# Patient Record
Sex: Female | Born: 1970
Health system: Southern US, Community
[De-identification: ages and names within clinical notes are randomized; demographics above are authoritative.]

## PROBLEM LIST (undated history)

## (undated) DIAGNOSIS — R102 Pelvic and perineal pain: Secondary | ICD-10-CM

## (undated) DIAGNOSIS — I10 Essential (primary) hypertension: Secondary | ICD-10-CM

## (undated) DIAGNOSIS — G43909 Migraine, unspecified, not intractable, without status migrainosus: Secondary | ICD-10-CM

## (undated) DIAGNOSIS — M797 Fibromyalgia: Secondary | ICD-10-CM

## (undated) DIAGNOSIS — N809 Endometriosis, unspecified: Secondary | ICD-10-CM

## (undated) DIAGNOSIS — I8290 Acute embolism and thrombosis of unspecified vein: Secondary | ICD-10-CM

## (undated) DIAGNOSIS — I639 Cerebral infarction, unspecified: Secondary | ICD-10-CM

## (undated) HISTORY — DX: Migraine, unspecified, not intractable, without status migrainosus: G43.909

## (undated) HISTORY — PX: LAPAROSCOPY: SHX197

## (undated) HISTORY — PX: ABDOMINAL HYSTERECTOMY: SHX81

## (undated) HISTORY — DX: Fibromyalgia: M79.7

## (undated) HISTORY — DX: Pelvic and perineal pain: R10.2

## (undated) HISTORY — DX: Endometriosis, unspecified: N80.9

## (undated) HISTORY — DX: Cerebral infarction, unspecified: I63.9

---

## 2002-05-18 ENCOUNTER — Emergency Department (HOSPITAL_COMMUNITY): Admission: EM | Admit: 2002-05-18 | Discharge: 2002-05-18 | Payer: Self-pay | Admitting: *Deleted

## 2002-05-18 ENCOUNTER — Encounter: Payer: Self-pay | Admitting: Emergency Medicine

## 2004-12-15 ENCOUNTER — Other Ambulatory Visit: Payer: Self-pay

## 2004-12-15 ENCOUNTER — Emergency Department: Payer: Self-pay | Admitting: Emergency Medicine

## 2005-09-05 ENCOUNTER — Observation Stay: Payer: Self-pay | Admitting: Obstetrics and Gynecology

## 2005-11-24 ENCOUNTER — Observation Stay: Payer: Self-pay | Admitting: Obstetrics and Gynecology

## 2005-12-11 ENCOUNTER — Observation Stay: Payer: Self-pay

## 2005-12-14 ENCOUNTER — Inpatient Hospital Stay: Payer: Self-pay | Admitting: Obstetrics and Gynecology

## 2006-12-08 ENCOUNTER — Ambulatory Visit: Payer: Self-pay | Admitting: General Practice

## 2007-01-21 ENCOUNTER — Ambulatory Visit: Payer: Self-pay | Admitting: Obstetrics and Gynecology

## 2007-09-07 ENCOUNTER — Ambulatory Visit: Payer: Self-pay | Admitting: General Practice

## 2008-11-29 ENCOUNTER — Emergency Department: Payer: Self-pay

## 2009-03-19 ENCOUNTER — Ambulatory Visit: Payer: Self-pay

## 2009-04-09 ENCOUNTER — Inpatient Hospital Stay: Payer: Self-pay

## 2010-01-01 ENCOUNTER — Ambulatory Visit: Payer: Self-pay | Admitting: General Practice

## 2010-12-15 ENCOUNTER — Inpatient Hospital Stay: Payer: Self-pay | Admitting: Internal Medicine

## 2010-12-22 ENCOUNTER — Ambulatory Visit: Payer: Self-pay | Admitting: General Practice

## 2011-01-01 ENCOUNTER — Ambulatory Visit: Payer: Self-pay | Admitting: General Practice

## 2011-01-30 ENCOUNTER — Ambulatory Visit: Payer: Self-pay | Admitting: Unknown Physician Specialty

## 2011-02-03 LAB — PATHOLOGY REPORT

## 2012-05-15 ENCOUNTER — Emergency Department: Payer: Self-pay | Admitting: Emergency Medicine

## 2012-06-06 ENCOUNTER — Ambulatory Visit: Payer: Self-pay | Admitting: General Practice

## 2012-12-11 ENCOUNTER — Emergency Department: Payer: Self-pay | Admitting: Emergency Medicine

## 2012-12-11 LAB — URINALYSIS, COMPLETE
Bacteria: NONE SEEN
Bilirubin,UR: NEGATIVE
Blood: NEGATIVE
Ketone: NEGATIVE
Leukocyte Esterase: NEGATIVE
Nitrite: NEGATIVE
Ph: 6 (ref 4.5–8.0)
RBC,UR: <1 /HPF (ref 0–5)
Specific Gravity: 1.014 (ref 1.003–1.030)
Squamous Epithelial: 1
WBC UR: <1 /HPF (ref 0–5)

## 2012-12-11 LAB — COMPREHENSIVE METABOLIC PANEL
Albumin: 3.8 g/dL (ref 3.4–5.0)
Alkaline Phosphatase: 57 U/L (ref 50–136)
Anion Gap: 5 — ABNORMAL LOW (ref 7–16)
BUN: 10 mg/dL (ref 7–18)
Bilirubin,Total: 0.6 mg/dL (ref 0.2–1.0)
Calcium, Total: 8.7 mg/dL (ref 8.5–10.1)
Chloride: 109 mmol/L — ABNORMAL HIGH (ref 98–107)
Co2: 26 mmol/L (ref 21–32)
Creatinine: 0.85 mg/dL (ref 0.60–1.30)
EGFR (Non-African Amer.): >60
Glucose: 100 mg/dL — ABNORMAL HIGH (ref 65–99)
Potassium: 3.7 mmol/L (ref 3.5–5.1)
SGOT(AST): 22 U/L (ref 15–37)
SGPT (ALT): 30 U/L (ref 12–78)
Sodium: 140 mmol/L (ref 136–145)
Total Protein: 7.4 g/dL (ref 6.4–8.2)

## 2012-12-11 LAB — CBC
HCT: 41.1 % (ref 35.0–47.0)
HGB: 13.8 g/dL (ref 12.0–16.0)
MCH: 31.4 pg (ref 26.0–34.0)
MCV: 94 fL (ref 80–100)
Platelet: 206 10*3/uL (ref 150–440)
RDW: 13.6 % (ref 11.5–14.5)
WBC: 7.5 10*3/uL (ref 3.6–11.0)

## 2012-12-11 LAB — LIPASE, BLOOD: Lipase: 97 U/L (ref 73–393)

## 2014-12-27 ENCOUNTER — Ambulatory Visit
Admit: 2014-12-27 | Disposition: A | Discharge status: Home / Self Care | Payer: Self-pay | Attending: Internal Medicine | Admitting: Internal Medicine

## 2015-01-18 ENCOUNTER — Ambulatory Visit
Admit: 2015-01-18 | Disposition: A | Discharge status: Home / Self Care | Payer: Self-pay | Attending: Internal Medicine | Admitting: Internal Medicine

## 2015-05-24 ENCOUNTER — Inpatient Hospital Stay: Payer: Self-pay | Admitting: Family Medicine

## 2015-12-28 ENCOUNTER — Emergency Department: Payer: BLUE CROSS/BLUE SHIELD

## 2015-12-28 ENCOUNTER — Emergency Department
Admission: EM | Admit: 2015-12-28 | Discharge: 2015-12-28 | Disposition: A | Discharge status: Home / Self Care | Payer: BLUE CROSS/BLUE SHIELD | Attending: Emergency Medicine | Admitting: Emergency Medicine

## 2015-12-28 ENCOUNTER — Encounter: Payer: Self-pay | Admitting: Emergency Medicine

## 2015-12-28 DIAGNOSIS — Y9289 Other specified places as the place of occurrence of the external cause: Secondary | ICD-10-CM | POA: Insufficient documentation

## 2015-12-28 DIAGNOSIS — W01198A Fall on same level from slipping, tripping and stumbling with subsequent striking against other object, initial encounter: Secondary | ICD-10-CM | POA: Insufficient documentation

## 2015-12-28 DIAGNOSIS — S8391XA Sprain of unspecified site of right knee, initial encounter: Secondary | ICD-10-CM | POA: Diagnosis not present

## 2015-12-28 DIAGNOSIS — Y998 Other external cause status: Secondary | ICD-10-CM | POA: Diagnosis not present

## 2015-12-28 DIAGNOSIS — S80211S Abrasion, right knee, sequela: Secondary | ICD-10-CM

## 2015-12-28 DIAGNOSIS — S8991XA Unspecified injury of right lower leg, initial encounter: Secondary | ICD-10-CM | POA: Diagnosis present

## 2015-12-28 DIAGNOSIS — Y9389 Activity, other specified: Secondary | ICD-10-CM | POA: Insufficient documentation

## 2015-12-28 DIAGNOSIS — S8001XA Contusion of right knee, initial encounter: Secondary | ICD-10-CM | POA: Diagnosis not present

## 2015-12-28 DIAGNOSIS — S80211A Abrasion, right knee, initial encounter: Secondary | ICD-10-CM | POA: Diagnosis not present

## 2015-12-28 MED ORDER — BACITRACIN ZINC 500 UNIT/GM EX OINT
TOPICAL_OINTMENT | Freq: Two times a day (BID) | CUTANEOUS | Status: DC
  Administered 2015-12-28: 1 via TOPICAL

## 2015-12-28 MED ORDER — NAPROXEN 500 MG PO TABS: 500.0000 mg | ORAL_TABLET | Freq: Two times a day (BID) | ORAL | Status: DC

## 2015-12-28 MED ORDER — BACITRACIN ZINC 500 UNIT/GM EX OINT
TOPICAL_OINTMENT | CUTANEOUS | Status: AC
  Administered 2015-12-28: 1 via TOPICAL
  Filled 2015-12-28: qty 0.9

## 2015-12-28 NOTE — Discharge Instructions (Signed)
Wear knee immobilizer as needed.

## 2015-12-28 NOTE — ED Provider Notes (Signed)
[facility] Emergency Department Provider Note  ____________________________________________  Time seen: Approximately 2:56 PM  I have reviewed the triage vital signs and the nursing notes   HISTORY  Chief Complaint Fall    HPI Sara Fitzpatrick is a 45 y.o. female patient complaining of right knee pain secondary to a fall this morning. Patient she fell landing on her left knee striking concrete. Patient states since the incident she's has increased pain and decreased flexion of the knee. Patient state pain with ambulation.Patient stated no relief of OTC NSAIDs. Patient rates the pain 6/10. Patient rates the pain as sharp. No other palliative measures for this complaint.   History reviewed. No pertinent past medical history.  There are no active problems to display for this patient.   History reviewed. No pertinent past surgical history.  No current outpatient prescriptions on file.  Allergies Morphine and related  History reviewed. No pertinent family history.  Social History Social History  Substance Use Topics  . Smoking status: Never Smoker   . Smokeless tobacco: None  . Alcohol Use: No    Review of Systems Constitutional: No fever/chills Eyes: No visual changes. ENT: No sore throat. Cardiovascular: Denies chest pain. Respiratory: Denies shortness of breath. Gastrointestinal: No abdominal pain.  No nausea, no vomiting.  No diarrhea.  No constipation. Genitourinary: Negative for dysuria. Musculoskeletal: Right knee pain Skin: Negative for rash. Neurological: Negative for headaches, focal weakness or numbness. Allergic/Immunilogical: Morphine  10-point ROS otherwise negative.  ____________________________________________   PHYSICAL EXAM:  VITAL SIGNS: ED Triage Vitals  Enc Vitals Group     BP 12/28/15 1318 142/78 mmHg     Pulse Rate 12/28/15 1318 101     Resp 12/28/15 1318 18     Temp 12/28/15 1318 97.7 F (36.5 C)   Temp Source 12/28/15 1318 Oral     SpO2 12/28/15 1318 100 %     Weight 12/28/15 1318 161 lb (73.029 kg)     Height 12/28/15 1318  (1.575 m)     Head Cir --      Peak Flow --      Pain Score 12/28/15 1318 6     Pain Loc --      Pain Edu? --      Excl. in GC? --     Constitutional: Alert and oriented. Well appearing and in no acute distress. Eyes: Conjunctivae are normal. PERRL. EOMI. Head: Atraumatic. Nose: No congestion/rhinnorhea. Mouth/Throat: Mucous membranes are moist.  Oropharynx non-erythematous. Neck: No stridor.  No cervical spine tenderness to palpation. Cardiovascular: Normal rate, regular rhythm. Grossly normal heart sounds.  Good peripheral circulation. Respiratory: Normal respiratory effort.  No retractions. Lungs CTAB. Gastrointestinal: Soft and nontender. No distention. No abdominal bruits. No CVA tenderness. Musculoskeletal: No deformity to the right knee. No loss edema. Patient has mild guarding palpation anterior patella and insertion point of the medial collateral ligament. Patient decreased range of motion with flexion secondary to complain of pain. No laxity with valgus and varus stress testing. Neurologic:  Normal speech and language. No gross focal neurologic deficits are appreciated. No gait instability. Skin:  Skin is warm, dry and intact. Abrasion anterior knee  Psychiatric: Mood and affect are normal. Speech and behavior are normal.  ____________________________________________   LABS (all labs ordered are listed, but only abnormal results are displayed)  Labs Reviewed - No data to display ____________________________________________  EKG   ____________________________________________  RADIOLOGY  No acute findings on x-ray. ____________________________________________   PROCEDURES  Procedure(s) performed: None  Critical Care performed: No  ____________________________________________   INITIAL IMPRESSION / ASSESSMENT AND PLAN / ED  COURSE  Pertinent labs & imaging results that were available during my care of the patient were reviewed by me and considered in my medical decision making (see chart for details).  Right knee contusion, right knee abrasion, and MCL strain of the right knee. Discussed x-ray findings with patient. She given discharge instructions. Patient advised to [facility] condition [facility]. ____________________________________________   FINAL CLINICAL IMPRESSION(S) / ED DIAGNOSES  Final diagnoses:  Contusion of right knee, initial encounter  Abrasion, right knee, sequela  Knee sprain, right, initial encounter      Joni ReiningRonald K Smith, PA-C 12/28/15 1608  Emily FilbertJonathan E Williams, MD 12/28/15 1725

## 2015-12-28 NOTE — ED Notes (Signed)
Pt to ed from home with c/o left knee pain and left ankle pain after falling.  Pt states she landed on left knee on cement.

## 2016-04-08 ENCOUNTER — Other Ambulatory Visit: Payer: Self-pay | Admitting: Family Medicine

## 2016-04-08 DIAGNOSIS — G43909 Migraine, unspecified, not intractable, without status migrainosus: Secondary | ICD-10-CM

## 2016-04-08 DIAGNOSIS — R531 Weakness: Secondary | ICD-10-CM

## 2016-04-08 DIAGNOSIS — H9311 Tinnitus, right ear: Secondary | ICD-10-CM

## 2016-04-09 ENCOUNTER — Other Ambulatory Visit: Payer: Self-pay | Admitting: Family Medicine

## 2016-04-09 DIAGNOSIS — Z1231 Encounter for screening mammogram for malignant neoplasm of breast: Secondary | ICD-10-CM

## 2016-04-28 ENCOUNTER — Other Ambulatory Visit: Payer: Self-pay | Admitting: Family Medicine

## 2016-04-28 ENCOUNTER — Ambulatory Visit
Admission: RE | Admit: 2016-04-28 | Discharge: 2016-04-28 | Disposition: A | Discharge status: Home / Self Care | Payer: BLUE CROSS/BLUE SHIELD | Source: Ambulatory Visit | Attending: Family Medicine | Admitting: Family Medicine

## 2016-04-28 DIAGNOSIS — I639 Cerebral infarction, unspecified: Secondary | ICD-10-CM | POA: Insufficient documentation

## 2016-04-28 DIAGNOSIS — R9082 White matter disease, unspecified: Secondary | ICD-10-CM | POA: Insufficient documentation

## 2016-04-28 DIAGNOSIS — Z1231 Encounter for screening mammogram for malignant neoplasm of breast: Secondary | ICD-10-CM

## 2016-04-28 DIAGNOSIS — R531 Weakness: Secondary | ICD-10-CM | POA: Diagnosis present

## 2016-04-28 DIAGNOSIS — G43909 Migraine, unspecified, not intractable, without status migrainosus: Secondary | ICD-10-CM | POA: Insufficient documentation

## 2016-04-29 ENCOUNTER — Ambulatory Visit
Admission: RE | Admit: 2016-04-29 | Discharge: 2016-04-29 | Disposition: A | Discharge status: Home / Self Care | Payer: BLUE CROSS/BLUE SHIELD | Source: Ambulatory Visit | Attending: Family Medicine | Admitting: Family Medicine

## 2016-04-29 DIAGNOSIS — H9311 Tinnitus, right ear: Secondary | ICD-10-CM

## 2016-04-29 DIAGNOSIS — Z1231 Encounter for screening mammogram for malignant neoplasm of breast: Secondary | ICD-10-CM | POA: Diagnosis not present

## 2016-04-29 DIAGNOSIS — G43909 Migraine, unspecified, not intractable, without status migrainosus: Secondary | ICD-10-CM

## 2016-04-29 DIAGNOSIS — R531 Weakness: Secondary | ICD-10-CM

## 2016-04-29 MED ORDER — GADOBENATE DIMEGLUMINE 529 MG/ML IV SOLN
15.0000 mL | Freq: Once | INTRAVENOUS | Status: AC | PRN
  Administered 2016-04-29: 15 mL via INTRAVENOUS

## 2016-09-01 ENCOUNTER — Encounter: Payer: Self-pay | Admitting: Obstetrics and Gynecology

## 2016-09-01 ENCOUNTER — Ambulatory Visit (INDEPENDENT_AMBULATORY_CARE_PROVIDER_SITE_OTHER): Payer: BLUE CROSS/BLUE SHIELD | Admitting: Obstetrics and Gynecology

## 2016-09-01 VITALS — BP 124/75 | HR 100 | Ht 62.0 in | Wt 181.8 lb

## 2016-09-01 DIAGNOSIS — R102 Pelvic and perineal pain: Secondary | ICD-10-CM

## 2016-09-01 DIAGNOSIS — N809 Endometriosis, unspecified: Secondary | ICD-10-CM | POA: Diagnosis not present

## 2016-09-01 DIAGNOSIS — Z98891 History of uterine scar from previous surgery: Secondary | ICD-10-CM | POA: Diagnosis not present

## 2016-09-01 DIAGNOSIS — Z90711 Acquired absence of uterus with remaining cervical stump: Secondary | ICD-10-CM

## 2016-09-01 NOTE — Progress Notes (Signed)
GYN ENCOUNTER NOTE  Subjective:       Sara Fitzpatrick is a 45 y.o. [id number] female is here for gynecologic evaluation of the following issues:  1. Left-sided abdominal pain and mass  Patient presents for evaluation of left-sided abdominal pain and palpable mass that she noted while leaning over the sink doing dishes. She felt pressure type discomfort and fullness. Bowel function is normal. Bladder function is normal. Patient does have chronic pelvic pain with dyspareunia due to endometriosis. These symptoms have not been exacerbated recently.  Summary of complex history:  History of cesarean section and BTL  History of laparoscopy 3  History of abdominal supracervical hysterectomy with evidence of uterus plastered to the anterior abdominal wall  Pathology from hysterectomy demonstrated adenomyosis in a 50 g specimen  [facility] were notable for identification of uterus on 2 ultrasounds and CT scan in 2015. Marland Kitchen.     Gynecologic History No LMP recorded. Patient has had a hysterectomy.Abdominal supracervical hysterectomy  Contraception: status post hysterectomy Last GNF:AOZHYQMPap:Unknown  Last mammogram: BI-RADS 1 04/28/2016   Obstetric History OB History  Gravida Para Term Preterm AB Living  2 1 1   1 1   SAB TAB Ectopic Multiple Live Births    1     1    # Outcome Date GA Lbr Len/2nd Weight Sex Delivery Anes PTL Lv  2 Term 2007   7 lb 1.8 oz (3.225 kg) F CS-LTranv   LIV  1 TAB 2000              Past Medical History:  Diagnosis Date  . Endometriosis   . Fibromyalgia   . Migraine   . Pelvic pain   . Stroke (cerebrum) [facility](HCC)     Past Surgical History:  Procedure Laterality Date  . ABDOMINAL HYSTERECTOMY     lsh  . CESAREAN SECTION    . LAPAROSCOPY     x 3    Current Outpatient Prescriptions on File Prior to Visit  Medication Sig Dispense Refill  . naproxen (NAPROSYN) 500 MG tablet Take 1 tablet (500 mg total) by mouth  2 (two) times daily with a meal. 20 tablet 00   No current facility-administered medications on file prior to visit.     Allergies  Allergen Reactions  . Other Anaphylaxis    paprika  . Hydrocodone-Acetaminophen Itching  . Morphine And Related Other (See Comments)    hypotensive    Social History   Social History  . Marital status: Married    Spouse name: N/A  . Number of children: N/A  . Years of education: N/A   Occupational History  . Not on file.   Social History Main Topics  . Smoking status: Never Smoker  . Smokeless tobacco: Current User  . Alcohol use No  . Drug use: No  . Sexual activity: Yes    Birth control/ protection: Surgical   Other Topics Concern  . Not on file   Social History Narrative  . No narrative on file    Family History  Problem Relation Age of Onset  . Diabetes Mother   . Heart disease Mother   . Heart disease Father   . Leukemia Maternal Grandmother   . Breast cancer Maternal Grandmother     60-70  . Heart disease Maternal Grandmother   . Colon cancer Maternal Grandfather   . Heart failure Maternal Grandfather   . Heart disease Maternal Grandfather   .  Heart failure Paternal Grandmother   . Heart disease Paternal Grandmother   . Heart disease Paternal Grandfather   . Ovarian cancer Neg Hx     The following portions of the patient's history were reviewed and updated as appropriate: allergies, current medications, past family history, past medical history, past social history, past surgical history and problem list.  Review of Systems Review of Systems -Per history of present illness  Objective:   BP 124/75   Pulse 100   Ht 5\' 2"  (1.575 m)   Wt 181 lb 12.8 oz (82.5 kg)   BMI 33.25 kg/m  CONSTITUTIONAL: Well-developed, well-nourished female in no acute distress.  HENT:  Normocephalic, atraumatic.  NECK: Normal range of motion, supple, no masses.  Normal thyroid.  SKIN: Skin is warm and dry. No rash noted. Not  diaphoretic. No erythema. No pallor. NEUROLGIC: Alert and oriented to person, place, and time. PSYCHIATRIC: Normal mood and affect. Normal behavior. Normal judgment and thought content. CARDIOVASCULAR:Not Examined RESPIRATORY: Not Examined BREASTS: Not Examined ABDOMEN: Soft, non distended; Non tender.  No Organomegaly. PELVIC:  External Genitalia: Normal  BUS: Normal  Vagina: Normal  Cervix:Scarred; situated anteriorly in the vagina, mobile, nontender  Uterus:Small, mobile, minimally tender 1/4, broad-based  Adnexa: Normal; Nonpalpable; left lower quadrant tenderness 2/4  RV: Normal   Bladder: Nontender MUSCULOSKELETAL: Normal range of motion. No tenderness.  No cyanosis, clubbing, or edema.     Assessment:   1. Endometriosis - US Pelvis Complete; Future - US Transvaginal Non-OB; Future  2. Pelvic pain - US Pelvis Complete; Future - US Transvaginal Non-OB; Future  3. Status abdominal supracervical hysterectomy   4. History of cesarean section     Plan:   1. Pelvic ultrasound 2. Return in 2 weeks for follow-up  A total of 15 minutes were spent face-to-face with the patient during this encounter and over half of that time dealt with counseling and coordination of care.  Herold HarmsMartin A Austin Pongratz, MD  Note: This dictation was prepared with Dragon dictation along with smaller phrase technology. Any transcriptional errors that result from this process are unintentional.

## 2016-09-01 NOTE — Patient Instructions (Signed)
1. Ultrasound is scheduled to assess ovaries 2. Return in 2 weeks (after ultrasound for further management planning)

## 2016-09-08 ENCOUNTER — Ambulatory Visit (INDEPENDENT_AMBULATORY_CARE_PROVIDER_SITE_OTHER): Payer: BLUE CROSS/BLUE SHIELD

## 2016-09-08 DIAGNOSIS — N809 Endometriosis, unspecified: Secondary | ICD-10-CM | POA: Diagnosis not present

## 2016-09-08 DIAGNOSIS — R102 Pelvic and perineal pain: Secondary | ICD-10-CM | POA: Diagnosis not present

## 2016-09-15 ENCOUNTER — Encounter: Payer: BLUE CROSS/BLUE SHIELD | Admitting: Obstetrics and Gynecology

## 2017-06-02 ENCOUNTER — Other Ambulatory Visit: Payer: Self-pay | Admitting: Family Medicine

## 2017-06-02 ENCOUNTER — Ambulatory Visit
Admission: RE | Admit: 2017-06-02 | Discharge: 2017-06-02 | Disposition: A | Discharge status: Home / Self Care | Payer: BLUE CROSS/BLUE SHIELD | Source: Ambulatory Visit | Attending: Family Medicine | Admitting: Family Medicine

## 2017-06-02 DIAGNOSIS — M79601 Pain in right arm: Secondary | ICD-10-CM | POA: Diagnosis not present

## 2017-06-02 DIAGNOSIS — I8290 Acute embolism and thrombosis of unspecified vein: Secondary | ICD-10-CM

## 2017-06-03 ENCOUNTER — Encounter (INDEPENDENT_AMBULATORY_CARE_PROVIDER_SITE_OTHER): Payer: Self-pay | Admitting: Vascular Surgery

## 2017-06-03 ENCOUNTER — Ambulatory Visit (INDEPENDENT_AMBULATORY_CARE_PROVIDER_SITE_OTHER): Payer: BLUE CROSS/BLUE SHIELD | Admitting: Vascular Surgery

## 2017-06-03 DIAGNOSIS — I742 Embolism and thrombosis of arteries of the upper extremities: Secondary | ICD-10-CM

## 2017-06-03 DIAGNOSIS — I998 Other disorder of circulatory system: Secondary | ICD-10-CM | POA: Diagnosis not present

## 2017-06-03 DIAGNOSIS — I7300 Raynaud's syndrome without gangrene: Secondary | ICD-10-CM

## 2017-06-03 DIAGNOSIS — M79644 Pain in right finger(s): Secondary | ICD-10-CM

## 2017-06-03 NOTE — Progress Notes (Signed)
MRN : [id number]  Sara SilviusStephanie F Gallogly is a 46 y.o. (29-Sep-1971) female who presents with chief complaint of  Chief Complaint  Patient presents with  . New Patient (Initial Visit)    index finger coldness and numbness  .  History of Present Illness: The patient is seen for evaluation of abrupt onset of pain and blue discoloration of the right index finger.  The patient notes the change has been present since Tuesday the 14th of August.  It is very painful but there is no ulceration or drainage.  No specific history of trauma noted by the patient.  The patient denies fever or chills.  She is right handed.  Patient notes prior to the discoloration developing the extremities were not painful with activity.    The patient denies similar symptoms of the lower extremity such as rest pain no dangling of an extremity off the side of the bed during the night for relief.  No prior interventions or surgeries.  She does note that in 2003 she experienced a similar event in the right arm and was admitted to the hospital and placed on a heparin gtt.  Arteriogram was done which she reports was normal.  She has had Raynaud's of the hands and feet for years.  No history of ulcers.  No history of back problems or DJD of the lumbar sacral spine.   The patient denies amaurosis fugax or recent TIA symptoms. There are no recent neurological changes noted. The patient denies history of DVT, PE or superficial thrombophlebitis. The patient denies recent episodes of angina or shortness of breath.     Current Meds  Medication Sig  . aspirin EC 81 MG tablet Take 81 mg by mouth daily.  . cyclobenzaprine (FLEXERIL) 5 MG tablet   . FLUoxetine (PROZAC) 20 MG capsule   . phentermine 37.5 MG capsule Take 37.5 mg by mouth every morning.  Carlena Hurl. XARELTO 15 MG TABS tablet     Past Medical History:  Diagnosis Date  . Endometriosis   . Fibromyalgia   . Migraine   . Pelvic pain   . Stroke (cerebrum) [facility](HCC)      Past Surgical History:  Procedure Laterality Date  . ABDOMINAL HYSTERECTOMY     lsh  . CESAREAN SECTION    . LAPAROSCOPY     x 3    Social History Social History  Substance Use Topics  . Smoking status: Never Smoker  . Smokeless tobacco: Current User  . Alcohol use No    Family History Family History  Problem Relation Age of Onset  . Diabetes Mother   . Heart disease Mother   . Heart disease Father   . Leukemia Maternal Grandmother   . Breast cancer Maternal Grandmother        60-70  . Heart disease Maternal Grandmother   . Colon cancer Maternal Grandfather   . Heart failure Maternal Grandfather   . Heart disease Maternal Grandfather   . Heart failure Paternal Grandmother   . Heart disease Paternal Grandmother   . Heart disease Paternal Grandfather   . Ovarian cancer Neg Hx   No family history of bleeding/clotting disorders, porphyria or autoimmune disease   Allergies  Allergen Reactions  . Other Anaphylaxis    paprika  . Hydrocodone-Acetaminophen Itching  . Morphine And Related Other (See Comments)    hypotensive     REVIEW OF SYSTEMS (Negative unless checked)  Constitutional: [] Weight loss  [] Fever  [] Chills Cardiac: [] Chest pain   []   Chest pressure   [] Palpitations   [] Shortness of breath when laying flat   [] Shortness of breath with exertion. Vascular:  [] Pain in legs with walking   [] Pain in legs at rest  [] History of DVT   [] Phlebitis   [] Swelling in legs   [] Varicose veins   [] Non-healing ulcers Pulmonary:   [] Uses home oxygen   [] Productive cough   [] Hemoptysis   [] Wheeze  [] COPD   [] Asthma Neurologic:  [] Dizziness   [] Seizures   [] History of stroke   [] History of TIA  [] Aphasia   [] Vissual changes   [] Weakness or numbness in arm   [] Weakness or numbness in leg Musculoskeletal:   [] Joint swelling   [] Joint pain   [] Low back pain Hematologic:  [] Easy bruising  [] Easy bleeding   [] Hypercoagulable state   [] Anemic Gastrointestinal:  [] Diarrhea    [] Vomiting  [] Gastroesophageal reflux/heartburn   [] Difficulty swallowing. Genitourinary:  [] Chronic kidney disease   [] Difficult urination  [] Frequent urination   [] Blood in urine Skin:  [] Rashes   [] Ulcers  Psychological:  [] History of anxiety   []  History of major depression.  Physical Examination  Vitals:   06/03/17 0847  BP: 126/81  Pulse: 95  Resp: 17  Weight: 186 lb (84.4 kg)  Height: 5\' 2"  (1.575 m)   Body mass index is 34.02 kg/m. Gen: WD/WN, NAD Head: [place]/AT, No temporalis wasting.  Ear/Nose/Throat: Hearing grossly intact, nares w/o erythema or drainage, poor dentition Eyes: PER, EOMI, sclera nonicteric.  Neck: Supple, no masses.  No bruit or JVD.  Pulmonary:  Good air movement, clear to auscultation bilaterally, no use of accessory muscles.  Cardiac: RRR, normal S1, S2, no Murmurs. Vascular: Right index finger is cyanotic and ischemic.  No ulcers Vessel Right Left  Radial Palpable Palpable  Ulnar Palpable Palpable  Brachial Palpable Palpable  Carotid Palpable Palpable  Femoral Palpable Palpable  Popliteal Not Palpable Not Palpable  PT Not Palpable Not Palpable  DP Not Palpable Not Palpable   Gastrointestinal: soft, non-distended. No guarding/no peritoneal signs.  Musculoskeletal: M/S 5/5 throughout.  No deformity or atrophy.  Neurologic: CN 2-12 intact. Pain and light touch intact in extremities.  Symmetrical.  Speech is fluent. Motor exam as listed above. Psychiatric: Judgment intact, Mood & affect appropriate for pt's clinical situation. Dermatologic: + rash right finger no ulcers noted.  No changes consistent with cellulitis. Lymph : No Cervical lymphadenopathy, no lichenification or skin changes of chronic lymphedema.  CBC Lab Results  Component Value Date   WBC 7.5 12/11/2012   HGB 13.8 12/11/2012   HCT 41.1 12/11/2012   MCV 94 12/11/2012   PLT 206 12/11/2012    BMET    Component Value Date/Time   NA 140 12/11/2012 1051   K 3.7 12/11/2012 1051    CL 109 (H) 12/11/2012 1051   CO2 26 12/11/2012 1051   GLUCOSE 100 (H) 12/11/2012 1051   BUN 10 12/11/2012 1051   CREATININE 0.85 12/11/2012 1051   CALCIUM 8.7 12/11/2012 1051   GFRNONAA >60 12/11/2012 1051   GFRAA >60 12/11/2012 1051   CrCl cannot be calculated (Patient's most recent lab result is older than the maximum 21 days allowed.).  COAG No results found for: INR, PROTIME  Radiology US Venous Img Upper Uni Right  Result Date: 06/02/2017 CLINICAL DATA:  Arm pain.  Swelling. EXAM: Right UPPER EXTREMITY VENOUS DOPPLER ULTRASOUND TECHNIQUE: Gray-scale sonography with graded compression, as well as color Doppler and duplex ultrasound were performed to evaluate the upper extremity deep venous system  from the level of the subclavian vein and including the jugular, axillary, basilic, radial, ulnar and upper cephalic vein. Spectral Doppler was utilized to evaluate flow at rest and with distal augmentation maneuvers. COMPARISON:  None. FINDINGS: Contralateral Subclavian Vein: Respiratory phasicity is normal and symmetric with the symptomatic side. No evidence of thrombus. Normal compressibility. Internal Jugular Vein: No evidence of thrombus. Normal compressibility, respiratory phasicity and response to augmentation. Subclavian Vein: No evidence of thrombus. Normal compressibility, respiratory phasicity and response to augmentation. Axillary Vein: No evidence of thrombus. Normal compressibility, respiratory phasicity and response to augmentation. Cephalic Vein: No evidence of thrombus. Normal compressibility, respiratory phasicity and response to augmentation. Basilic Vein: No evidence of thrombus. Normal compressibility, respiratory phasicity and response to augmentation. Brachial Veins: No evidence of thrombus. Normal compressibility, respiratory phasicity and response to augmentation. Radial Veins: No evidence of thrombus. Normal compressibility, respiratory phasicity and response to augmentation.  Ulnar Veins: No evidence of thrombus. Normal compressibility, respiratory phasicity and response to augmentation. Venous Reflux:  None visualized. Other Findings:  None visualized. IMPRESSION: No evidence of DVT within the right upper extremity. Electronically Signed   By: Signa Kell M.D.   On: 06/02/2017 13:58    Assessment/Plan 1. Ischemic finger Recommend:  Patient should undergo CT angiography of the right arm and chest to evaluate for a source of embolization.  The scan should be obtained ASAP because there has been a significant deterioration in the patient's lower extremity symptoms.  The patient states they are having increased pain.  The risks and benefits as well as the alternatives were discussed in detail with the patient.  All questions were answered.  Patient agrees to proceed and understands this could be a prelude to angiography and intervention.  The patient will follow up with me in the office to review the studies.   A total of 67 minutes was spent with this patient and greater than 50% was spent in counseling and coordination of care with the patient.  Discussion included the treatment options for vascular disease including indications for surgery and intervention.  Also discussed is the appropriate timing of treatment.  In addition medical therapy was discussed.   - CT ANGIO UP EXTREM RIGHT W &/OR WO CONTRAST; Future  2. 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  - CT ANGIO UP EXTREM RIGHT W &/OR WO CONTRAST; Future - Ambulatory referral to Cardiology  3. Pain in finger of right hand See #1&2  4. Raynaud's phenomenon without gangrene The patient's Raynaud's is present and her symptoms are stable.  Behavioral modification was stressed; avoidance of cold and utilizing wool socks was reviewed again.  The reported BP today was above the AHA goal and therefore there will continue  Norvasc.  The patient will follow up in as ordered        Levora Dredge, MD  06/03/2017 12:44 PM

## 2017-06-04 ENCOUNTER — Telehealth (INDEPENDENT_AMBULATORY_CARE_PROVIDER_SITE_OTHER): Payer: Self-pay

## 2017-06-04 ENCOUNTER — Ambulatory Visit
Admission: RE | Admit: 2017-06-04 | Discharge: 2017-06-04 | Disposition: A | Discharge status: Home / Self Care | Payer: BLUE CROSS/BLUE SHIELD | Source: Ambulatory Visit | Attending: Vascular Surgery | Admitting: Vascular Surgery

## 2017-06-04 DIAGNOSIS — I998 Other disorder of circulatory system: Secondary | ICD-10-CM | POA: Diagnosis present

## 2017-06-04 DIAGNOSIS — I742 Embolism and thrombosis of arteries of the upper extremities: Secondary | ICD-10-CM | POA: Diagnosis not present

## 2017-06-04 MED ORDER — IOPAMIDOL (ISOVUE-370) INJECTION 76%
75.0000 mL | Freq: Once | INTRAVENOUS | Status: AC | PRN
  Administered 2017-06-04: 75 mL via INTRAVENOUS

## 2017-06-04 NOTE — Telephone Encounter (Signed)
CT department called with their findings today:   1. Appearance is normal from arch of Right upper extremity, NO ASO, NO STENOSIS, NO FOCAL OCCLUSION  2. Evaluation of hand and digital limited with CTA

## 2017-06-08 NOTE — Telephone Encounter (Signed)
Agree perfect

## 2017-06-30 ENCOUNTER — Telehealth (INDEPENDENT_AMBULATORY_CARE_PROVIDER_SITE_OTHER): Payer: Self-pay | Admitting: Vascular Surgery

## 2017-06-30 ENCOUNTER — Other Ambulatory Visit (INDEPENDENT_AMBULATORY_CARE_PROVIDER_SITE_OTHER): Payer: Self-pay

## 2017-06-30 NOTE — Telephone Encounter (Signed)
Will ask Dr. Gilda Fitzpatrick about her CT scan results and if she is still supposed to be taking the Xarelto.

## 2017-06-30 NOTE — Telephone Encounter (Signed)
Sheila called, initially to asVelna Hatchetked what is this patient plan of care because they are receiving request to refill the patients Xarelto and are unsure if the patient needs to continue. We went over the notes and phone call that was in the system and she decided to contact patient because we are both unclear. She called back and said the patient has yet to hear from us regarding her CT results. I advised her that we tell the patient to call us once it is scheduled so that we can get them back in. Silvio PateShelia states patient says that GS told her he would call Dr. Lady GaryFath directly to coordinate holter moniter and she has not heard anything. Please call patient and advise.

## 2017-07-05 NOTE — Telephone Encounter (Signed)
Called patient to let her know that she just needs to schedule a follow-up in the office and that Dr. Gilda Fitzpatrick will go over her results with her then and discuss wether or not the Xarelto is still necessary.

## 2017-10-24 ENCOUNTER — Other Ambulatory Visit: Payer: Self-pay

## 2017-10-24 ENCOUNTER — Emergency Department: Payer: BLUE CROSS/BLUE SHIELD

## 2017-10-24 ENCOUNTER — Emergency Department
Admission: EM | Admit: 2017-10-24 | Discharge: 2017-10-24 | Disposition: A | Discharge status: Home / Self Care | Payer: BLUE CROSS/BLUE SHIELD | Attending: Emergency Medicine | Admitting: Emergency Medicine

## 2017-10-24 ENCOUNTER — Encounter: Payer: Self-pay | Admitting: *Deleted

## 2017-10-24 DIAGNOSIS — R635 Abnormal weight gain: Secondary | ICD-10-CM | POA: Diagnosis not present

## 2017-10-24 DIAGNOSIS — R1030 Lower abdominal pain, unspecified: Secondary | ICD-10-CM | POA: Insufficient documentation

## 2017-10-24 DIAGNOSIS — M545 Low back pain, unspecified: Secondary | ICD-10-CM

## 2017-10-24 LAB — CBC
HCT: 42.7 % (ref 35.0–47.0)
HEMOGLOBIN: 14.2 g/dL (ref 12.0–16.0)
MCH: 31.1 pg (ref 26.0–34.0)
MCHC: 33.3 g/dL (ref 32.0–36.0)
MCV: 93.2 fL (ref 80.0–100.0)
Platelets: 250 10*3/uL (ref 150–440)
RBC: 4.58 MIL/uL (ref 3.80–5.20)
RDW: 13.9 % (ref 11.5–14.5)
WBC: 6.3 10*3/uL (ref 3.6–11.0)

## 2017-10-24 LAB — COMPREHENSIVE METABOLIC PANEL
ALK PHOS: 54 U/L (ref 38–126)
ALT: 22 U/L (ref 14–54)
ANION GAP: 7 (ref 5–15)
AST: 25 U/L (ref 15–41)
Albumin: 4 g/dL (ref 3.5–5.0)
BUN: 12 mg/dL (ref 6–20)
CALCIUM: 8.5 mg/dL — AB (ref 8.9–10.3)
CO2: 25 mmol/L (ref 22–32)
CREATININE: 0.86 mg/dL (ref 0.44–1.00)
Chloride: 105 mmol/L (ref 101–111)
Glucose, Bld: 95 mg/dL (ref 65–99)
Potassium: 3.7 mmol/L (ref 3.5–5.1)
Sodium: 137 mmol/L (ref 135–145)
TOTAL PROTEIN: 7.1 g/dL (ref 6.5–8.1)
Total Bilirubin: 0.3 mg/dL (ref 0.3–1.2)

## 2017-10-24 LAB — URINALYSIS, COMPLETE (UACMP) WITH MICROSCOPIC
BILIRUBIN URINE: NEGATIVE
Glucose, UA: NEGATIVE mg/dL
HGB URINE DIPSTICK: NEGATIVE
Ketones, ur: NEGATIVE mg/dL
Leukocytes, UA: NEGATIVE
NITRITE: NEGATIVE
PH: 6 (ref 5.0–8.0)
Protein, ur: NEGATIVE mg/dL
SPECIFIC GRAVITY, URINE: 1.014 (ref 1.005–1.030)

## 2017-10-24 LAB — LIPASE, BLOOD: LIPASE: 28 U/L (ref 11–51)

## 2017-10-24 MED ORDER — IOPAMIDOL (ISOVUE-300) INJECTION 61%
100.0000 mL | Freq: Once | INTRAVENOUS | Status: AC | PRN
  Administered 2017-10-24: 100 mL via INTRAVENOUS

## 2017-10-24 NOTE — ED Provider Notes (Signed)
[facility] Emergency Department Provider Note ____________________________________________   I have reviewed the triage vital signs and the triage nursing note.  HISTORY  Chief Complaint Abdominal Pain and Shortness of Breath   Historian Patient  HPI Sara Fitzpatrick is a 47 y.o. female with a history of endometriosis, fibromyalgia, pelvic pain, and obesity who is presenting with weight gain over the past 3 or 4 months, as well as progressive low back pain and pain into the abdomen which is moderate to severe.  She is mostly concerned about the possibility of cancer as she had a grandfather with colon cancer and another family member with cervical cancer.  She states that she gets short of breath when she walks around.  No shortness of breath or chest pains at rest.  No chest pain.  Denies fever.  Denies urinary symptoms.  States that she has multiple bowel movements per day, not necessarily watery.  No bloody stools.     Past Medical History:  Diagnosis Date  . Endometriosis   . Fibromyalgia   . Migraine   . Pelvic pain   . Stroke (cerebrum) [facility](HCC)     Patient Active Problem List   Diagnosis Date Noted  . Ischemic finger 06/03/2017  . Embolism and thrombosis of arteries of upper extremities (HCC) 06/03/2017  . Pain in finger of right hand 06/03/2017  . Raynaud phenomenon 06/03/2017  . History of cesarean section 09/01/2016  . Status post laparoscopic supracervical hysterectomy 09/01/2016  . Pelvic pain 09/01/2016  . Endometriosis 09/01/2016    Past Surgical History:  Procedure Laterality Date  . ABDOMINAL HYSTERECTOMY     lsh  . CESAREAN SECTION    . LAPAROSCOPY     x 3    Prior to Admission medications   Medication Sig Start Date End Date Taking? Authorizing Provider  Aspirin-Salicylamide-Caffeine (BC HEADACHE POWDER PO) Take 1 Package by mouth as needed (migraines).   Yes [provider]  FLUoxetine (PROZAC) 20 MG  capsule Take 20 mg by mouth daily.  05/25/17  Yes [provider]  naproxen (NAPROSYN) 500 MG tablet Take 1 tablet (500 mg total) by mouth 2 (two) times daily with a meal. Patient not taking: Reported on 06/03/2017 12/28/15   Joni ReiningSmith, Ronald K, PA-C    Allergies  Allergen Reactions  . Other Anaphylaxis    paprika  . Hydrocodone-Acetaminophen Itching  . Morphine And Related Other (See Comments)    hypotensive    Family History  Problem Relation Age of Onset  . Diabetes Mother   . Heart disease Mother   . Heart disease Father   . Leukemia Maternal Grandmother   . Breast cancer Maternal Grandmother        60-70  . Heart disease Maternal Grandmother   . Colon cancer Maternal Grandfather   . Heart failure Maternal Grandfather   . Heart disease Maternal Grandfather   . Heart failure Paternal Grandmother   . Heart disease Paternal Grandmother   . Heart disease Paternal Grandfather   . Ovarian cancer Neg Hx     Social History Social History   Tobacco Use  . Smoking status: Former Games developermoker  . Smokeless tobacco: Current User  Substance Use Topics  . Alcohol use: No  . Drug use: No    Review of Systems  Constitutional: Negative for fever. Eyes: Negative for visual changes. ENT: Negative for sore throat. Cardiovascular: Negative for chest pain. Respiratory: No coughing.  She has some shortness of breath when she  walks around. Gastrointestinal: Mild nausea and decreased p.o. intake, although she states that she is been gaining weight.  Some lower abdominal pain at times.   Genitourinary: Negative for dysuria. Musculoskeletal: Negative for back pain. Skin: Negative for rash. Neurological: Negative for headache.  ____________________________________________   PHYSICAL EXAM:  VITAL SIGNS: ED Triage Vitals  Enc Vitals Group     BP 10/24/17 1111 137/69     Pulse Rate 10/24/17 1111 97     Resp 10/24/17 1111 16     Temp 10/24/17 1111 98 F (36.7 C)     Temp Source  10/24/17 1111 Oral     SpO2 10/24/17 1111 100 %     Weight 10/24/17 1112 168 lb (76.2 kg)     Height 10/24/17 1112 5\' 2"  (1.575 m)     Head Circumference --      Peak Flow --      Pain Score 10/24/17 1129 3     Pain Loc --      Pain Edu? --      Excl. in GC? --      Constitutional: Alert and oriented. Well appearing and in no distress. HEENT   Head: Normocephalic and atraumatic.      Eyes: Conjunctivae are normal. Pupils equal and round.       Ears:         Nose: No congestion/rhinnorhea.   Mouth/Throat: Mucous membranes are moist.   Neck: No stridor. Cardiovascular/Chest: Normal rate, regular rhythm.  No murmurs, rubs, or gallops. Respiratory: Normal respiratory effort without tachypnea nor retractions. Breath sounds are clear and equal bilaterally. No wheezes/rales/rhonchi. Gastrointestinal: Soft. No guarding, no rebound.  Obese.  Mild distention.  She does have some tenderness especially lower abdomen. Genitourinary/rectal:Deferred Musculoskeletal: Nontender with normal range of motion in all extremities. No joint effusions.  No lower extremity tenderness.  No edema. Neurologic:  Normal speech and language. No gross or focal neurologic deficits are appreciated. Skin:  Skin is warm, dry and intact. No rash noted. Psychiatric: Mood and affect are normal. Speech and behavior are normal. Patient exhibits appropriate insight and judgment.   ____________________________________________  LABS (pertinent positives/negatives) I, Governor Rooks, MD the attending physician have reviewed the labs noted below.  Labs Reviewed  URINALYSIS, COMPLETE (UACMP) WITH MICROSCOPIC - Abnormal; Notable for the following components:      Result Value   Color, Urine YELLOW (*)    APPearance CLEAR (*)    Bacteria, UA RARE (*)    Squamous Epithelial / LPF 0-5 (*)    All other components within normal limits  COMPREHENSIVE METABOLIC PANEL - Abnormal; Notable for the following components:    Calcium 8.5 (*)    All other components within normal limits  URINE CULTURE  CBC  LIPASE, BLOOD    ____________________________________________    EKG I, Governor Rooks, MD, the attending physician have personally viewed and interpreted all ECGs.  80 bpm.  Normal sinus rhythm with sinus arrhythmia.  Narrow transfer normal axis.  Nonspecific ST and T wave ____________________________________________  RADIOLOGY All Xrays were viewed by me.  Imaging interpreted by Radiologist, and I, Governor Rooks, MD the attending physician have reviewed the radiologist interpretation noted below.  CT abdomen pelvis with contrast:  IMPRESSION: 1. No acute findings in the abdomen or pelvis. Specifically, no findings to explain the patient's history of abdominal pain and distention. __________________________________________  PROCEDURES  Procedure(s) performed: None  Critical Care performed: None   ____________________________________________  ED COURSE / ASSESSMENT AND  PLAN  Pertinent labs & imaging results that were available during my care of the patient were reviewed by me and considered in my medical decision making (see chart for details).   Patient looks really worried here.  She is complaining of several months of weight that is been up and down despite trial treatments on phentermine and then Prozac and then she went off of both of these because of persistent weight gain.  She feels like she is gained excess weight over the past 3 months.  She is also been having low back pain is pain into the abdomen and is having some significant concerns of the possibility of cancer.  This is because of some strong family history.  We discussed reassuring laboratory studies, she does have some tenderness in her lower abdomen and she is complaining of back pain.  Urinalysis is not clearly consistent with urine tract infection, she is not having dysuria and I am to send for urine culture.  We  discussed risk and benefit of obtaining CT scan today and are going to go ahead and proceed.  CT scan is overall reassuring with no emergency finding that should be related to her discomfort.  I did discuss reassuring evaluation here in the emerge department, and recommend that she closely follows up with both primary care doctor as well as possible GI.  I went ahead and gave her the office number to call for GI as well.  DIFFERENTIAL DIAGNOSIS: Differential diagnosis includes, but is not limited to, ovarian cyst, ovarian torsion, acute appendicitis, diverticulitis, urinary tract infection/pyelonephritis, endometriosis, bowel obstruction, colitis, renal colic, gastroenteritis, hernia, fibroids, endometriosis, pregnancy related pain including ectopic pregnancy, etc.  CONSULTATIONS:   None   Patient / Family / Caregiver informed of clinical course, medical decision-making process, and agree with plan.   I discussed return precautions, follow-up instructions, and discharge instructions with patient and/or family.  Discharge Instructions : You are evaluated for weight gain and back pain, and although no certain cause was found, your exam and evaluation are overall reassuring in the emergency department today.  We discussed, I do recommend that you follow back up with your primary care doctor, and possibly see gastroenterologist for stool issues.  Return to emerge department immediately for any worsening condition including fever, black or bloody stool, vomiting blood, chest pain or trouble breathing, or any other symptoms concerning to you.    ___________________________________________   FINAL CLINICAL IMPRESSION(S) / ED DIAGNOSES   Final diagnoses:  Weight gain  Bilateral low back pain without sciatica, unspecified chronicity      ___________________________________________        Note: This dictation was prepared with Dragon dictation. Any transcriptional errors that  result from this process are unintentional    Governor Rooks, MD 10/24/17 1527

## 2017-10-24 NOTE — ED Notes (Signed)
Recollect green sent to lab 

## 2017-10-24 NOTE — Discharge Instructions (Signed)
You are evaluated for weight gain and back pain, and although no certain cause was found, your exam and evaluation are overall reassuring in the emergency department today.  We discussed, I do recommend that you follow back up with your primary care doctor, and possibly see gastroenterologist for stool issues.  Return to emerge department immediately for any worsening condition including fever, black or bloody stool, vomiting blood, chest pain or trouble breathing, or any other symptoms concerning to you.

## 2017-10-24 NOTE — ED Notes (Signed)
Pt up to toilet 

## 2017-10-24 NOTE — ED Notes (Signed)
pt back from CT

## 2017-10-24 NOTE — ED Notes (Signed)
Patient transported to CT 

## 2017-10-24 NOTE — ED Notes (Signed)
FIRST NURSE NOTE: pt reports increased in abdominal size over the past several months, states she is having shortness of breath at times. Pt states she is not pregnant because she had a partial hysterectomy.

## 2017-10-24 NOTE — ED Triage Notes (Signed)
Per patient's report, patient c/o abdominal pain/distention and shortness of breath that has worsened over several months. Patient has taken a diet pill intermittently. Patient also c/o frequent BM's.

## 2017-10-25 LAB — URINE CULTURE

## 2018-04-14 ENCOUNTER — Other Ambulatory Visit: Payer: Self-pay

## 2018-04-14 ENCOUNTER — Emergency Department
Admission: EM | Admit: 2018-04-14 | Discharge: 2018-04-14 | Disposition: A | Discharge status: Home / Self Care | Payer: BLUE CROSS/BLUE SHIELD | Attending: Emergency Medicine | Admitting: Emergency Medicine

## 2018-04-14 ENCOUNTER — Emergency Department: Payer: BLUE CROSS/BLUE SHIELD

## 2018-04-14 ENCOUNTER — Encounter: Payer: Self-pay | Admitting: Emergency Medicine

## 2018-04-14 DIAGNOSIS — Z87891 Personal history of nicotine dependence: Secondary | ICD-10-CM | POA: Diagnosis not present

## 2018-04-14 DIAGNOSIS — R079 Chest pain, unspecified: Secondary | ICD-10-CM | POA: Diagnosis present

## 2018-04-14 DIAGNOSIS — I493 Ventricular premature depolarization: Secondary | ICD-10-CM | POA: Diagnosis not present

## 2018-04-14 DIAGNOSIS — R0789 Other chest pain: Secondary | ICD-10-CM | POA: Insufficient documentation

## 2018-04-14 DIAGNOSIS — Z79899 Other long term (current) drug therapy: Secondary | ICD-10-CM | POA: Insufficient documentation

## 2018-04-14 DIAGNOSIS — Z8673 Personal history of transient ischemic attack (TIA), and cerebral infarction without residual deficits: Secondary | ICD-10-CM | POA: Insufficient documentation

## 2018-04-14 HISTORY — DX: Acute embolism and thrombosis of unspecified vein: I82.90

## 2018-04-14 LAB — CBC
HCT: 41.6 % (ref 35.0–47.0)
Hemoglobin: 14.1 g/dL (ref 12.0–16.0)
MCH: 31.6 pg (ref 26.0–34.0)
MCHC: 33.9 g/dL (ref 32.0–36.0)
MCV: 93.1 fL (ref 80.0–100.0)
PLATELETS: 230 10*3/uL (ref 150–440)
RBC: 4.47 MIL/uL (ref 3.80–5.20)
RDW: 13.4 % (ref 11.5–14.5)
WBC: 7.5 10*3/uL (ref 3.6–11.0)

## 2018-04-14 LAB — BASIC METABOLIC PANEL
Anion gap: 8 (ref 5–15)
BUN: 20 mg/dL (ref 6–20)
CALCIUM: 8.8 mg/dL — AB (ref 8.9–10.3)
CO2: 25 mmol/L (ref 22–32)
CREATININE: 0.78 mg/dL (ref 0.44–1.00)
Chloride: 104 mmol/L (ref 98–111)
GFR calc Af Amer: >60 mL/min (ref >60)
Glucose, Bld: 101 mg/dL — ABNORMAL HIGH (ref 70–99)
Potassium: 3.7 mmol/L (ref 3.5–5.1)
SODIUM: 137 mmol/L (ref 135–145)

## 2018-04-14 LAB — TROPONIN I
Troponin I: <0.03 ng/mL (ref <0.03)
Troponin I: <0.03 ng/mL (ref <0.03)

## 2018-04-14 MED ORDER — NITROGLYCERIN 0.4 MG SL SUBL
0.4000 mg | SUBLINGUAL_TABLET | Freq: Once | SUBLINGUAL | Status: AC
  Administered 2018-04-14: 0.4 mg via SUBLINGUAL

## 2018-04-14 MED ORDER — NITROGLYCERIN 0.4 MG SL SUBL
SUBLINGUAL_TABLET | SUBLINGUAL | Status: AC
  Filled 2018-04-14: qty 1

## 2018-04-14 MED ORDER — FENTANYL CITRATE (PF) 100 MCG/2ML IJ SOLN
12.5000 ug | Freq: Once | INTRAMUSCULAR | Status: AC
  Administered 2018-04-14: 12.5 ug via INTRAVENOUS
  Filled 2018-04-14: qty 2

## 2018-04-14 MED ORDER — IOHEXOL 350 MG/ML SOLN
75.0000 mL | Freq: Once | INTRAVENOUS | Status: AC | PRN
  Administered 2018-04-14: 75 mL via INTRAVENOUS

## 2018-04-14 NOTE — ED Notes (Signed)
Patient transported to CT 

## 2018-04-14 NOTE — ED Triage Notes (Signed)
Chest pressure since about 630am and says it felt like heart was irregular.

## 2018-04-14 NOTE — ED Provider Notes (Signed)
[facility] Emergency Department Provider Note   ____________________________________________    I have reviewed the triage vital signs and the nursing notes.   HISTORY  Chief Complaint Chest Pain     HPI Sara Fitzpatrick is a 47 y.o. female who presents with complaints of chest pressure.  Patient reports when she woke up this morning apparently 6:30 AM she felt a pressure in the center of her chest which she describes as moderate.  She is never had this before.  She does report a history of PVCs in the past which is caused her intermittent pain.  She does also report a history of a blood clot in the right arm.  Denies pleurisy.  No diaphoresis.  No fevers or chills.  No recent travel.  No calf pain or swelling.  History of heart disease in her parents   Past Medical History:  Diagnosis Date  . Blood clot in vein    right arm  . Endometriosis   . Fibromyalgia   . Migraine   . Pelvic pain   . Stroke (cerebrum) [facility](HCC)     Patient Active Problem List   Diagnosis Date Noted  . Ischemic finger 06/03/2017  . Embolism and thrombosis of arteries of upper extremities (HCC) 06/03/2017  . Pain in finger of right hand 06/03/2017  . Raynaud phenomenon 06/03/2017  . History of cesarean section 09/01/2016  . Status post laparoscopic supracervical hysterectomy 09/01/2016  . Pelvic pain 09/01/2016  . Endometriosis 09/01/2016    Past Surgical History:  Procedure Laterality Date  . ABDOMINAL HYSTERECTOMY     lsh  . CESAREAN SECTION    . LAPAROSCOPY     x 3    Prior to Admission medications   Medication Sig Start Date End Date Taking? Authorizing Provider  Aspirin-Salicylamide-Caffeine (BC HEADACHE POWDER PO) Take 1 Package by mouth as needed (migraines).   Yes [provider]  busPIRone (BUSPAR) 10 MG tablet Take 10 mg by mouth 3 (three) times daily as needed (anxiety).   Yes [provider]  Calcium 600-200 MG-UNIT tablet Take 1  tablet by mouth daily.   Yes [provider]     Allergies Other; Hydrocodone-acetaminophen; and Morphine and related  Family History  Problem Relation Age of Onset  . Diabetes Mother   . Heart disease Mother   . Heart disease Father   . Leukemia Maternal Grandmother   . Breast cancer Maternal Grandmother        60-70  . Heart disease Maternal Grandmother   . Colon cancer Maternal Grandfather   . Heart failure Maternal Grandfather   . Heart disease Maternal Grandfather   . Heart failure Paternal Grandmother   . Heart disease Paternal Grandmother   . Heart disease Paternal Grandfather   . Ovarian cancer Neg Hx     Social History Social History   Tobacco Use  . Smoking status: Former Games developermoker  . Smokeless tobacco: Current User  Substance Use Topics  . Alcohol use: No  . Drug use: No    Review of Systems  Constitutional: No fever/chills Eyes: No visual changes.  ENT: No sore throat. Cardiovascular: As above. Respiratory: Denies shortness of breath. Gastrointestinal: No abdominal pain.  N Genitourinary: Negative for dysuria. Musculoskeletal: Negative for back pain. Skin: Negative for rash. Neurological: Negative for headaches or weakness   ____________________________________________   PHYSICAL EXAM:  VITAL SIGNS: ED Triage Vitals  Enc Vitals Group     BP 04/14/18 1013 135/81  Pulse Rate 04/14/18 1013 98     Resp 04/14/18 1013 20     Temp 04/14/18 1013 98.8 F (37.1 C)     Temp Source 04/14/18 1013 Oral     SpO2 04/14/18 1013 100 %     Weight 04/14/18 1002 91.6 kg (202 lb)     Height 04/14/18 1002 1.575 m (5\' 2" )     Head Circumference --      Peak Flow --      Pain Score 04/14/18 1002 5     Pain Loc --      Pain Edu? --      Excl. in GC? --     Constitutional: Alert and oriented.  Pleasant and interactive Eyes: Conjunctivae are normal.   Nose: No congestion/rhinnorhea. Mouth/Throat: Mucous membranes are moist.    Cardiovascular:  Normal rate, regular rhythm. Grossly normal heart sounds.  Good peripheral circulation. Respiratory: Normal respiratory effort.  No retractions. Lungs CTAB. Gastrointestinal: Soft and nontender. No distention.  No CVA tenderness.  Musculoskeletal: No lower extremity tenderness nor edema.  Warm and well perfused Neurologic:  Normal speech and language. No gross focal neurologic deficits are appreciated.  Skin:  Skin is warm, dry and intact. No rash noted. Psychiatric: Mood and affect are normal. Speech and behavior are normal.  ____________________________________________   LABS (all labs ordered are listed, but only abnormal results are displayed)  Labs Reviewed  BASIC METABOLIC PANEL - Abnormal; Notable for the following components:      Result Value   Glucose, Bld 101 (*)    Calcium 8.8 (*)    All other components within normal limits  CBC  TROPONIN I  TROPONIN I   ____________________________________________  EKG  ED ECG REPORT I, Jene Every, the attending physician, personally viewed and interpreted this ECG.  Date: 04/14/2018  Rhythm: normal sinus rhythm QRS Axis: normal Intervals: normal ST/T Wave abnormalities: normal Narrative Interpretation: Occasional PVCs  ____________________________________________  RADIOLOGY  Chest x-ray unremarkable CT angiography negative for PE ____________________________________________   PROCEDURES  Procedure(s) performed: No  Procedures   Critical Care performed: No ____________________________________________   INITIAL IMPRESSION / ASSESSMENT AND PLAN / ED COURSE  Pertinent labs & imaging results that were available during my care of the patient were reviewed by me and considered in my medical decision making (see chart for details).  Patient presents with chest pressure as described above.  EKG is overall reassuring, history of PVCs known.  Initial troponin normal.  CT angiography normal.  Will check second  troponin.  Patient feeling better after sublingual nitroglycerin.  ----------------------------------------- 2:28 PM on 04/14/2018 -----------------------------------------  Second troponin is normal.  Patient feels well and is chest pain-free.  Discussed with Dr. Lady Gary who can see the patient at 2 PM tomorrow.  Patient is anxious to go home, she will return if  symptoms return ____________________________________________   FINAL CLINICAL IMPRESSION(S) / ED DIAGNOSES  Final diagnoses:  Atypical chest pain  PVC's (premature ventricular contractions)        Note:  This document was prepared using Dragon voice recognition software and may include unintentional dictation errors.    Jene Every, MD 04/14/18 1428

## 2018-04-24 DIAGNOSIS — R079 Chest pain, unspecified: Secondary | ICD-10-CM | POA: Insufficient documentation

## 2018-07-13 ENCOUNTER — Other Ambulatory Visit: Payer: Self-pay

## 2018-07-13 ENCOUNTER — Emergency Department: Payer: BLUE CROSS/BLUE SHIELD

## 2018-07-13 ENCOUNTER — Encounter: Payer: Self-pay | Admitting: Emergency Medicine

## 2018-07-13 ENCOUNTER — Emergency Department
Admission: EM | Admit: 2018-07-13 | Discharge: 2018-07-13 | Disposition: A | Discharge status: Home / Self Care | Payer: BLUE CROSS/BLUE SHIELD | Attending: Emergency Medicine | Admitting: Emergency Medicine

## 2018-07-13 DIAGNOSIS — H538 Other visual disturbances: Secondary | ICD-10-CM | POA: Diagnosis not present

## 2018-07-13 DIAGNOSIS — R200 Anesthesia of skin: Secondary | ICD-10-CM | POA: Insufficient documentation

## 2018-07-13 DIAGNOSIS — Z87891 Personal history of nicotine dependence: Secondary | ICD-10-CM | POA: Diagnosis not present

## 2018-07-13 DIAGNOSIS — Z79899 Other long term (current) drug therapy: Secondary | ICD-10-CM | POA: Diagnosis not present

## 2018-07-13 DIAGNOSIS — R51 Headache: Secondary | ICD-10-CM | POA: Diagnosis not present

## 2018-07-13 DIAGNOSIS — R202 Paresthesia of skin: Secondary | ICD-10-CM

## 2018-07-13 DIAGNOSIS — M549 Dorsalgia, unspecified: Secondary | ICD-10-CM | POA: Insufficient documentation

## 2018-07-13 DIAGNOSIS — R519 Headache, unspecified: Secondary | ICD-10-CM

## 2018-07-13 LAB — COMPREHENSIVE METABOLIC PANEL
ALBUMIN: 3.9 g/dL (ref 3.5–5.0)
ALT: 15 U/L (ref 0–44)
AST: 18 U/L (ref 15–41)
Alkaline Phosphatase: 57 U/L (ref 38–126)
Anion gap: 7 (ref 5–15)
BUN: 11 mg/dL (ref 6–20)
CHLORIDE: 106 mmol/L (ref 98–111)
CO2: 26 mmol/L (ref 22–32)
Calcium: 8.5 mg/dL — ABNORMAL LOW (ref 8.9–10.3)
Creatinine, Ser: 0.91 mg/dL (ref 0.44–1.00)
GFR calc Af Amer: >60 mL/min (ref >60)
GFR calc non Af Amer: >60 mL/min (ref >60)
GLUCOSE: 94 mg/dL (ref 70–99)
POTASSIUM: 4 mmol/L (ref 3.5–5.1)
Sodium: 139 mmol/L (ref 135–145)
Total Bilirubin: 0.3 mg/dL (ref 0.3–1.2)
Total Protein: 7 g/dL (ref 6.5–8.1)

## 2018-07-13 LAB — CBC
HCT: 36.5 % (ref 35.0–47.0)
HEMOGLOBIN: 12.8 g/dL (ref 12.0–16.0)
MCH: 32.7 pg (ref 26.0–34.0)
MCHC: 35.2 g/dL (ref 32.0–36.0)
MCV: 92.9 fL (ref 80.0–100.0)
Platelets: 243 10*3/uL (ref 150–440)
RBC: 3.93 MIL/uL (ref 3.80–5.20)
RDW: 13.9 % (ref 11.5–14.5)
WBC: 5.4 10*3/uL (ref 3.6–11.0)

## 2018-07-13 LAB — TROPONIN I: Troponin I: <0.03 ng/mL (ref <0.03)

## 2018-07-13 LAB — DIFFERENTIAL
BASOS ABS: 0 10*3/uL (ref 0–0.1)
BASOS PCT: 0 %
EOS ABS: 0.1 10*3/uL (ref 0–0.7)
Eosinophils Relative: 2 %
LYMPHS ABS: 1.8 10*3/uL (ref 1.0–3.6)
Lymphocytes Relative: 33 %
Monocytes Absolute: 0.3 10*3/uL (ref 0.2–0.9)
Monocytes Relative: 6 %
NEUTROS ABS: 3.1 10*3/uL (ref 1.4–6.5)
NEUTROS PCT: 59 %

## 2018-07-13 MED ORDER — DIPHENHYDRAMINE HCL 50 MG/ML IJ SOLN
25.0000 mg | Freq: Once | INTRAMUSCULAR | Status: AC
  Administered 2018-07-13: 25 mg via INTRAVENOUS
  Filled 2018-07-13: qty 1

## 2018-07-13 MED ORDER — METOCLOPRAMIDE HCL 5 MG/ML IJ SOLN
10.0000 mg | Freq: Once | INTRAMUSCULAR | Status: AC
  Administered 2018-07-13: 10 mg via INTRAVENOUS
  Filled 2018-07-13: qty 2

## 2018-07-13 MED ORDER — SODIUM CHLORIDE 0.9 % IV BOLUS
1000.0000 mL | Freq: Once | INTRAVENOUS | Status: AC
Start: 2018-07-13 — End: 2018-07-13
  Administered 2018-07-13: 1000 mL via INTRAVENOUS

## 2018-07-13 MED ORDER — KETOROLAC TROMETHAMINE 30 MG/ML IJ SOLN
INTRAMUSCULAR | Status: AC
  Filled 2018-07-13: qty 1

## 2018-07-13 MED ORDER — GADOBENATE DIMEGLUMINE 529 MG/ML IV SOLN
20.0000 mL | Freq: Once | INTRAVENOUS | Status: AC | PRN
  Administered 2018-07-13: 19 mL via INTRAVENOUS

## 2018-07-13 MED ORDER — KETOROLAC TROMETHAMINE 30 MG/ML IJ SOLN
30.0000 mg | Freq: Once | INTRAMUSCULAR | Status: AC
  Administered 2018-07-13: 30 mg via INTRAVENOUS

## 2018-07-13 NOTE — ED Notes (Signed)
Patient transported to X-ray 

## 2018-07-13 NOTE — ED Provider Notes (Signed)
MRI without any acute findings. Findings compatible with small chronic microvascular changes. Question small right cerebral infarct vs atypical cavernoma. Discussed the findings with the patient. Discussed importance of follow up with her neurologist. She does state that her headache has improved at this time.    Sara Fitzpatrick, Sara Altamirano, MD 07/13/18 [phone]

## 2018-07-13 NOTE — ED Notes (Signed)
First Nurse Note: Patient complaining of "heavy feeling in right leg", mid-back pain and a HA.  Alert and oriented.

## 2018-07-13 NOTE — ED Notes (Signed)
Sent a rainbow to lab  ° °

## 2018-07-13 NOTE — ED Provider Notes (Signed)
[facility] EMERGENCY DEPARTMENT Provider Note   CSN: [id number] Arrival date & time: 07/13/18  1002     History   Chief Complaint Chief Complaint  Patient presents with  . Leg Problem  . Back Pain  . Headache    HPI Sara Fitzpatrick is a 47 y.o. female history of previous stroke, endometriosis, fibromyalgia here presenting with blurry vision, headaches, back pain, right leg numbness.  She states that she has been having intermittent blurry vision and headaches for the last several weeks.  States that it comes and goes but usually in the posterior head area.  Patient denies any passing out or vomiting.  She has intermittent numbness on the lateral aspect of the right foot that is been going on for the last several days that got worse this morning.  She tried to call her chiropractor and neurologist but was unable to get an appointment.  Denies any trouble speaking or trouble walking or incontinence.  She also has some lower back pain but denies any falls.  Patient had MRI of the brain several years ago that did not show any multiple sclerosis. Patient is concerned that she may have new onset multiple sclerosis.   The history is provided by the patient.    Past Medical History:  Diagnosis Date  . Blood clot in vein    right arm  . Endometriosis   . Fibromyalgia   . Migraine   . Pelvic pain   . Stroke (cerebrum) [facility])     Patient Active Problem List   Diagnosis Date Noted  . Ischemic finger 06/03/2017  . Embolism and thrombosis of arteries of upper extremities (HCC) 06/03/2017  . Pain in finger of right hand 06/03/2017  . Raynaud phenomenon 06/03/2017  . History of cesarean section 09/01/2016  . Status post laparoscopic supracervical hysterectomy 09/01/2016  . Pelvic pain 09/01/2016  . Endometriosis 09/01/2016    Past Surgical History:  Procedure Laterality Date  . ABDOMINAL HYSTERECTOMY     lsh  . CESAREAN SECTION    . LAPAROSCOPY     x 3       OB History    Gravida  2   Para  1   Term  1   Preterm      AB  1   Living  1     SAB      TAB  1   Ectopic      Multiple      Live Births  1            Home Medications    Prior to Admission medications   Medication Sig Start Date End Date Taking? Authorizing Provider  Aspirin-Salicylamide-Caffeine (BC HEADACHE POWDER PO) Take 1 Package by mouth as needed (migraines).    [provider]  busPIRone (BUSPAR) 10 MG tablet Take 10 mg by mouth 3 (three) times daily as needed (anxiety).    [provider]  Calcium 600-200 MG-UNIT tablet Take 1 tablet by mouth daily.    [provider]    Family History Family History  Problem Relation Age of Onset  . Diabetes Mother   . Heart disease Mother   . Heart disease Father   . Leukemia Maternal Grandmother   . Breast cancer Maternal Grandmother        60-70  . Heart disease Maternal Grandmother   . Colon cancer Maternal Grandfather   . Heart failure Maternal Grandfather   . Heart disease Maternal  Grandfather   . Heart failure Paternal Grandmother   . Heart disease Paternal Grandmother   . Heart disease Paternal Grandfather   . Ovarian cancer Neg Hx     Social History Social History   Tobacco Use  . Smoking status: Former Games developer  . Smokeless tobacco: Current User  Substance Use Topics  . Alcohol use: No  . Drug use: No     Allergies   Other; Hydrocodone-acetaminophen; and Morphine and related   Review of Systems Review of Systems  Musculoskeletal: Positive for back pain.  Neurological: Positive for numbness and headaches.  All other systems reviewed and are negative.    Physical Exam Updated Vital Signs BP 139/73 (BP Location: Left Arm)   Pulse 89   Temp 98.7 F (37.1 C) (Oral)   Resp 18   Ht 5\' 2"  (1.575 m)   Wt 90.7 kg   SpO2 100%   BMI 36.58 kg/m   Physical Exam  Constitutional: She is oriented to person, place, and time.  Slightly anxious   HENT:   Head: Normocephalic.  Mouth/Throat: Oropharynx is clear and moist.  Eyes: Pupils are equal, round, and reactive to light. EOM are normal.  Neck: Normal range of motion. Neck supple.  Cardiovascular: Normal rate.  Pulmonary/Chest: Effort normal and breath sounds normal.  Abdominal: Soft. Bowel sounds are normal.  Musculoskeletal: Normal range of motion.  Neurological: She is alert and oriented to person, place, and time.  CN 2- 12 intact. Nl strength throughout. Slightly dec sensation lateral aspect R calf and foot, nl plantar flexion and dorsiflexion, nl reflexes and pulses bilaterally. Mild R paralumbar tenderness   Skin: Skin is warm.  Psychiatric: She has a normal mood and affect. Her behavior is normal.  Nursing note and vitals reviewed.    ED Treatments / Results  Labs (all labs ordered are listed, but only abnormal results are displayed) Labs Reviewed  COMPREHENSIVE METABOLIC PANEL - Abnormal; Notable for the following components:      Result Value   Calcium 8.5 (*)    All other components within normal limits  CBC  DIFFERENTIAL  TROPONIN I  POC URINE PREG, ED    EKG EKG Interpretation  Date/Time:  Wednesday July 13 2018 10:16:59 EDT Ventricular Rate:  83 PR Interval:  126 QRS Duration: 76 QT Interval:  364 QTC Calculation: 427 R Axis:   50 Text Interpretation:  Sinus rhythm with Premature supraventricular complexes and with occasional Premature ventricular complexes Cannot rule out Anterior infarct (cited on or before 14-Apr-2018) Abnormal ECG When compared with ECG of 14-Apr-2018 10:11, Premature supraventricular complexes are now Present Confirmed by Richardean Canal [phone]) on 07/13/2018 11:50:19 AM   Radiology Dg Chest 2 View  Result Date: 07/13/2018 CLINICAL DATA:  Transient neurologic symptoms including blurred vision and headaches. Back pain and dizziness. EXAM: CHEST - 2 VIEW COMPARISON:  Chest CT 04/14/2018 FINDINGS: The heart size and mediastinal  contours are within normal limits. Both lungs are clear. The visualized skeletal structures are unremarkable. IMPRESSION: No active cardiopulmonary disease. Electronically Signed   By: Gaylyn Rong M.D.   On: 07/13/2018 13:04   Dg Lumbar Spine Complete  Result Date: 07/13/2018 CLINICAL DATA:  Patient presents to the ED with transient neurological symptoms, including occasional blurry vision and headaches. Patient states over the summer she has been having pain in her right foot that is intermittent. EXAM: LUMBAR SPINE - COMPLETE 4+ VIEW COMPARISON:  CT abdomen 10/24/2017 FINDINGS: There is no evidence of lumbar  spine fracture. Alignment is normal. Intervertebral disc spaces are maintained. IMPRESSION: Negative. Electronically Signed   By: Gaylyn Rong M.D.   On: 07/13/2018 12:33    Procedures Procedures (including critical care time)  Angiocath insertion Performed by: Richardean Canal  Consent: Verbal consent obtained. Risks and benefits: risks, benefits and alternatives were discussed Time out: Immediately prior to procedure a "time out" was called to verify the correct patient, procedure, equipment, support staff and site/side marked as required.  Preparation: Patient was prepped and draped in the usual sterile fashion.  Vein Location: L antecube  Ultrasound Guided  Gauge: 20 long   Normal blood return and flush without difficulty Patient tolerance: Patient tolerated the procedure well with no immediate complications.     Medications Ordered in ED Medications  metoCLOPramide (REGLAN) injection 10 mg (10 mg Intravenous Given 07/13/18 1237)  diphenhydrAMINE (BENADRYL) injection 25 mg (25 mg Intravenous Given 07/13/18 1237)  sodium chloride 0.9 % bolus 1,000 mL (1,000 mLs Intravenous New Bag/Given 07/13/18 1237)  gadobenate dimeglumine (MULTIHANCE) injection 20 mL (19 mLs Intravenous Contrast Given 07/13/18 1507)     Initial Impression / Assessment and Plan / ED Course  I  have reviewed the triage vital signs and the nursing notes.  Pertinent labs & imaging results that were available during my care of the patient were reviewed by me and considered in my medical decision making (see chart for details).     Sara Fitzpatrick is a 47 y.o. female here with back pain, blurry vision, numbness. Consider multiple sclerosis vs complex migraines vs lumbar radiculopathy vs fibromyalgia. Will get MRI brain and cervical spine. Will get labs, xrays of lower back.   3:17 PM Labs unremarkable. MRI pending. Signed out to Dr. Derrill Kay to follow up MRI and reassess patient. Anticipate dc home if negative.      Final Clinical Impressions(s) / ED Diagnoses   Final diagnoses:  None    ED Discharge Orders    None       Charlynne Pander, MD 07/13/18 1517

## 2018-07-13 NOTE — Discharge Instructions (Addendum)
Please seek medical attention for any high fevers, chest pain, shortness of breath, change in behavior, persistent vomiting, bloody stool or any other new or concerning symptoms.  

## 2018-07-13 NOTE — ED Notes (Signed)
Patient transported to MRI 

## 2018-07-13 NOTE — ED Triage Notes (Signed)
Patient presents to the ED with transient neurological symptoms, including occasional blurry vision and headaches.  Patient states over the summer she has been having pain in her right foot that is intermittent.  Patient also reports constant lower back pain.  Patient states today she has a headache, back pain and feels like her right foot is "desensitized."  Patient states she has tried to get an appointment with a chiropractor and a neurologist but does not yet have an appointment.  Patient reports some concern about MS.  Denies history of MS.

## 2019-03-28 ENCOUNTER — Other Ambulatory Visit: Payer: Self-pay

## 2019-03-28 ENCOUNTER — Ambulatory Visit (INDEPENDENT_AMBULATORY_CARE_PROVIDER_SITE_OTHER): Payer: BC Managed Care – PPO | Admitting: Obstetrics and Gynecology

## 2019-03-28 ENCOUNTER — Encounter: Payer: Self-pay | Admitting: Obstetrics and Gynecology

## 2019-03-28 VITALS — BP 128/79 | HR 81 | Ht 62.0 in | Wt 220.4 lb

## 2019-03-28 DIAGNOSIS — N644 Mastodynia: Secondary | ICD-10-CM | POA: Diagnosis not present

## 2019-03-28 DIAGNOSIS — R5383 Other fatigue: Secondary | ICD-10-CM | POA: Diagnosis not present

## 2019-03-28 DIAGNOSIS — R6882 Decreased libido: Secondary | ICD-10-CM | POA: Diagnosis not present

## 2019-03-28 DIAGNOSIS — N951 Menopausal and female climacteric states: Secondary | ICD-10-CM

## 2019-03-28 DIAGNOSIS — Z90721 Acquired absence of ovaries, unilateral: Secondary | ICD-10-CM

## 2019-03-28 DIAGNOSIS — L678 Other hair color and hair shaft abnormalities: Secondary | ICD-10-CM

## 2019-03-28 DIAGNOSIS — Z9079 Acquired absence of other genital organ(s): Secondary | ICD-10-CM

## 2019-03-28 DIAGNOSIS — Z90710 Acquired absence of both cervix and uterus: Secondary | ICD-10-CM

## 2019-03-28 DIAGNOSIS — R140 Abdominal distension (gaseous): Secondary | ICD-10-CM | POA: Diagnosis not present

## 2019-03-28 DIAGNOSIS — Z8742 Personal history of other diseases of the female genital tract: Secondary | ICD-10-CM

## 2019-03-28 DIAGNOSIS — N393 Stress incontinence (female) (male): Secondary | ICD-10-CM

## 2019-03-28 DIAGNOSIS — Z1239 Encounter for other screening for malignant neoplasm of breast: Secondary | ICD-10-CM

## 2019-03-28 DIAGNOSIS — E559 Vitamin D deficiency, unspecified: Secondary | ICD-10-CM

## 2019-03-28 NOTE — Patient Instructions (Addendum)
Abdominal Bloating When you have abdominal bloating, your abdomen may feel full, tight, or painful. It may also look bigger than normal or swollen (distended). Common causes of abdominal bloating include:  Swallowing air.  Constipation.  Problems digesting food.  Eating too much.  Irritable bowel syndrome. This is a condition that affects the large intestine.  Lactose intolerance. This is an inability to digest lactose, a natural sugar in dairy products.  Celiac disease. This is a condition that affects the ability to digest gluten, a protein found in some grains.  Gastroparesis. This is a condition that slows down the movement of food in the stomach and small intestine. It is more common in people with diabetes mellitus.  Gastroesophageal reflux disease (GERD). This is a digestive condition that makes stomach acid flow back into the esophagus.  Urinary retention. This means that the body is holding onto urine, and the bladder cannot be emptied all the way. Follow these instructions at home: Eating and drinking  Avoid eating too much.  Try not to swallow air while talking or eating.  Avoid eating while lying down.  Avoid these foods and drinks: ? Foods that cause gas, such as broccoli, cabbage, cauliflower, and baked beans. ? Carbonated drinks. ? Hard candy. ? Chewing gum. Medicines  Take over-the-counter and prescription medicines only as told by your health care provider.  Take probiotic medicines. These medicines contain live bacteria or yeasts that can help digestion.  Take coated peppermint oil capsules. Activity  Try to exercise regularly. Exercise may help to relieve bloating that is caused by gas and relieve constipation. General instructions  Keep all follow-up visits as told by your health care provider. This is important. Contact a health care provider if:  You have nausea and vomiting.  You have diarrhea.  You have abdominal pain.  You have unusual  weight loss or weight gain.  You have severe pain, and medicines do not help. Get help right away if:  You have severe chest pain.  You have trouble breathing.  You have shortness of breath.  You have trouble urinating.  You have darker urine than normal.  You have blood in your stools or have dark, tarry stools. Summary  Abdominal bloating means that the abdomen is swollen.  Common causes of abdominal bloating are swallowing air, constipation, and problems digesting food.  Avoid eating too much and avoid swallowing air.  Avoid foods that cause gas, carbonated drinks, hard candy, and chewing gum. This information is not intended to replace advice given to you by your health care provider. Make sure you discuss any questions you have with your health care provider. Document Released: 11/06/2016 Document Revised: 11/06/2016 Document Reviewed: 11/06/2016 Elsevier Interactive Patient Education  2019 [employer] Inc[employer]   Breast Tenderness Breast tenderness is a common problem for women of all ages. Breast tenderness may cause mild discomfort to severe pain. The pain usually comes and goes in association with your menstrual cycle, but it can be constant. Breast tenderness has many possible causes, including hormone changes and some medicines. Your health care provider may order tests, such as a mammogram or an ultrasound, to check for any unusual findings. Having breast tenderness usually does not mean that you have breast cancer. Follow these instructions at home: Sometimes, reassurance that you do not have breast cancer is all that is needed. In general, follow these home care instructions: Managing pain and discomfort   If directed, apply ice to the area: ? Put ice in a plastic  bag. ? Place a towel between your skin and the bag. ? Leave the ice on for 20 minutes, 2-3 times a day.  Make sure you are wearing a supportive bra, especially during exercise. You may also want to wear a  supportive bra while sleeping if your breasts are very tender. Medicines  Take over-the-counter and prescription medicines only as told by your health care provider. If the cause of your pain is infection, you may be prescribed an antibiotic medicine.  If you were prescribed an antibiotic, take it as told by your health care provider. Do not stop taking the antibiotic even if you start to feel better. General instructions   Your health care provider may recommend that you reduce the amount of fat in your diet. You can do this by: ? Limiting fried foods. ? Cooking foods using methods, such as baking, boiling, grilling, and broiling.  Decrease the amount of caffeine in your diet. You can do this by drinking more water and choosing caffeine-free options.  Keep a log of the days and times when your breasts are most tender.  Ask your health care provider how to do breast exams at home. This will help you notice if you have an unusual growth or lump. Contact a health care provider if:  Any part of your breast is hard, red, and hot to the touch. This may be a sign of infection.  You are not breastfeeding and you have fluid, especially blood or pus, coming out of your nipples.  You have a fever.  You have a new or painful lump in your breast that remains after your menstrual period ends.  Your pain does not improve or it gets worse.  Your pain is interfering with your daily activities. This information is not intended to replace advice given to you by your health care provider. Make sure you discuss any questions you have with your health care provider. Document Released: 09/17/2008 Document Revised: 07/03/2016 Document Reviewed: 07/03/2016 Elsevier Interactive Patient Education  2019 [employer]    Kegel Exercises Kegel exercises help strengthen the muscles that support the rectum, vagina, small intestine, bladder, and uterus. Doing Kegel exercises can help:  Improve bladder and  bowel control.  Improve sexual response.  Reduce problems and discomfort during pregnancy. Kegel exercises involve squeezing your pelvic floor muscles, which are the same muscles you squeeze when you try to stop the flow of urine. The exercises can be done while sitting, standing, or lying down, but it is best to vary your position. Exercises 1. Squeeze your pelvic floor muscles tight. You should feel a tight lift in your rectal area. If you are a female, you should also feel a tightness in your vaginal area. Keep your stomach, buttocks, and legs relaxed. 2. Hold the muscles tight for up to 10 seconds. 3. Relax your muscles. Repeat this exercise 50 times a day or as many times as told by your health care provider. Continue to do this exercise for at least 4-6 weeks or for as long as told by your health care provider. This information is not intended to replace advice given to you by your health care provider. Make sure you discuss any questions you have with your health care provider. Document Released: 09/21/2012 Document Revised: 02/15/2017 Document Reviewed: 08/25/2015 Elsevier Interactive Patient Education  2019 [employer]vier Inc.     Urinary Incontinence  Urinary incontinence refers to a condition in which a person is unable to control where and when to pass  urine. A person with this condition will urinate when he or she does not mean to (involuntarily). What are the causes? This condition may be caused by:  Medicines.  Infections.  Constipation.  Overactive bladder muscles.  Weak bladder muscles.  Weak pelvic floor muscles. These muscles provide support for the bladder, intestine, and, in women, the uterus.  Enlarged prostate in men. The prostate is a gland near the bladder. When it gets too big, it can pinch the urethra. With the urethra blocked, the bladder can weaken and lose the ability to empty properly.  Surgery.  Emotional factors, such as anxiety, stress, or  post-traumatic stress disorder (PTSD).  Pelvic organ prolapse. This happens in women when organs shift out of place and into the vagina. This shift can prevent the bladder and urethra from working properly. What increases the risk? The following factors may make you more likely to develop this condition:  Older age.  Obesity and physical inactivity.  Pregnancy and childbirth.  Menopause.  Diseases that affect the nerves or spinal cord (neurological diseases).  Long-term (chronic) coughing. This can increase pressure on the bladder and pelvic floor muscles. What are the signs or symptoms? Symptoms may vary depending on the type of urinary incontinence you have. They include:  A sudden urge to urinate, but passing urine involuntarily before you can get to a bathroom (urge incontinence).  Suddenly passing urine with any activity that forces urine to pass, such as coughing, laughing, exercise, or sneezing (stress incontinence).  Needing to urinate often, but urinating only a small amount, or constantly dribbling urine (overflow incontinence).  Urinating because you cannot get to the bathroom in time due to a physical disability, such as arthritis or injury, or communication and thinking problems, such as Alzheimer disease (functional incontinence). How is this diagnosed? This condition may be diagnosed based on:  Your medical history.  A physical exam.  Tests, such as: ? Urine tests. ? X-rays of your kidney and bladder. ? Ultrasound. ? CT scan. ? Cystoscopy. In this procedure, a health care provider inserts a tube with a light and camera (cystoscope) through the urethra and into the bladder in order to check for problems. ? Urodynamic testing. These tests assess how well the bladder, urethra, and sphincter can store and release urine. There are different types of urodynamic tests, and they vary depending on what the test is measuring. To help diagnose your condition, your health  care provider may recommend that you keep a log of when you urinate and how much you urinate. How is this treated? Treatment for this condition depends on the type of incontinence that you have and its cause. Treatment may include:  Lifestyle changes, such as: ? Quitting smoking. ? Maintaining a healthy weight. ? Staying active. Try to get 150 minutes of moderate-intensity exercise every week. Ask your health care provider which activities are safe for you. ? Eating a healthy diet.  Avoid high-fat foods, like fried foods.  Avoid refined carbohydrates like white bread and white rice.  Limit how much alcohol and caffeine you drink.  Increase your fiber intake. Foods such as fresh fruits, vegetables, beans, and whole grains are healthy sources of fiber.  Pelvic floor muscle exercises.  Bladder training, such as lengthening the amount of time between bathroom breaks, or using the bathroom at regular intervals.  Using techniques to suppress bladder urges. This can include distraction techniques or controlled breathing exercises.  Medicines to relax the bladder muscles and prevent bladder spasms.  Medicines to help slow or prevent the growth of a man's prostate.  Botox injections. These can help relax the bladder muscles.  Using pulses of electricity to help change bladder reflexes (electrical nerve stimulation).  For women, using a medical device to prevent urine leaks. This is a small, tampon-like, disposable device that is inserted into the urethra.  Injecting collagen or carbon beads (bulking agents) into the urinary sphincter. These can help thicken tissue and close the bladder opening.  Surgery. Follow these instructions at home: Lifestyle  Limit alcohol and caffeine. These can fill your bladder quickly and irritate it.  Keep yourself clean to help prevent odors and skin damage. Ask your doctor about special skin creams and cleansers that can protect the skin from urine.   Consider wearing pads or adult diapers. Make sure to change them regularly, and always change them right after experiencing incontinence. General instructions  Take over-the-counter and prescription medicines only as told by your health care provider.  Use the bathroom about every 3-4 hours, even if you do not feel the need to urinate. Try to empty your bladder completely every time. After urinating, wait a minute. Then try to urinate again.  Make sure you are in a relaxed position while urinating.  If your incontinence is caused by nerve problems, keep a log of the medicines you take and the times you go to the bathroom.  Keep all follow-up visits as told by your health care provider. This is important. Contact a health care provider if:  You have pain that gets worse.  Your incontinence gets worse. Get help right away if:  You have a fever or chills.  You are unable to urinate.  You have redness in your groin area or down your legs. Summary  Urinary incontinence refers to a condition in which a person is unable to control where and when to pass urine.  This condition may be caused by medicines, infection, weak bladder muscles, weak pelvic floor muscles, enlargement of the prostate (in men), or surgery.  The following factors increase your risk for developing this condition: older age, obesity, pregnancy and childbirth, menopause, neurological diseases, and chronic coughing.  There are several types of urinary incontinence. They include urge incontinence, stress incontinence, overflow incontinence, and functional incontinence.  This condition is usually treated first with lifestyle and behavioral changes, such as quitting smoking, eating a healthier diet, and doing regular pelvic floor exercises. Other treatment options include medicines, bulking agents, medical devices, electrical nerve stimulation, or surgery. This information is not intended to replace advice given to you by  your health care provider. Make sure you discuss any questions you have with your health care provider. Document Released: 11/12/2004 Document Revised: 01/14/2017 Document Reviewed: 01/14/2017 Elsevier Interactive Patient Education  2019 [employer].

## 2019-03-28 NOTE — Progress Notes (Signed)
Pt is present today due to having breast pain, fatigue, bloating, lower back pain, nausea, lack of sex drive when she coughs or sneezes she is not able to control her bladder over a year.

## 2019-03-28 NOTE — Progress Notes (Signed)
GYNECOLOGY PROGRESS NOTE  Subjective:    Patient ID: Sara SilviusStephanie F Fitzpatrick, female    DOB: [birth year], 48 y.o.   MRN: [id number]  HPI  Patient is a 48 y.o. 672P1011 female with prior history of hysterectomy with right oophorectomy (unsure if cervix remains) for a prior history of endometriosis who presents for complaints of 3 weeks of breast pain (however notes that it recently resolved when she made her appointment for today).  She had also noted during that time complaints of 3 days of nausea and vomiting.  She also endorses frequent urination with leakage of small amounts of urine, decreased sex drive, weight gain, and abdominal bloating (despite eating small meals). She has been noting fatigue for "quite some time". Of note, patient has seen Dr. Greggory Keenefrancesco in the past who retired in December 2019.   The following portions of the patient's history were reviewed and updated as appropriate:  She  has a past medical history of Blood clot in vein, Endometriosis, Fibromyalgia, Migraine, Pelvic pain, and Stroke (cerebrum) (HCC).   She  has a past surgical history that includes Cesarean section; laparoscopy; and Abdominal hysterectomy (supracervical) with right oophorectomy.   Her family history includes Breast cancer in her maternal grandmother; Colon cancer in her maternal grandfather; Diabetes in her mother; Heart disease in her father, maternal grandfather, maternal grandmother, mother, paternal grandfather, and paternal grandmother; Heart failure in her maternal grandfather and paternal grandmother; Leukemia in her maternal grandmother.   She  reports that she has quit smoking. She uses smokeless tobacco. She reports current alcohol use. She reports that she does not use drugs.   She has a current medication list which includes the following prescription(s): aspirin ec, aspirin-salicylamide-caffeine, calcium, and buspirone.   She is allergic to other; hydrocodone-acetaminophen; and morphine  and related..  Review of Systems A comprehensive review of systems was negative except for: Skin: increased hair growth of chin   Objective:   Blood pressure 128/79, pulse 81, height 5\' 2"  (1.575 m), weight 220 lb 6.4 oz (100 kg). General appearance: alert and no distress Abdomen: normal findings: bowel sounds normal, no masses palpable and soft and abnormal findings:  mild tenderness in the lower abdomen Pelvic: external genitalia normal, rectovaginal septum normal.  Vagina without discharge. Unable to visualize cervix due to being significantly anteriorly displaced as well as discomfort during exam. Cervix palpable on bimanual exam, no lesions detected.  Uterus surgically absent. Right adnexa surgically absent. Left adnexa non-palpable, mildly tender bilaterally.  Extremities: extremities normal, atraumatic, no cyanosis or edema Neurologic: Grossly normal   Assessment:   Mastalgia  Fatigue, unspecified type Abdominal bloating Decreased sex drive Stress incontinence Perimenopausal Abnormal facial hair History of endometriosis   Plan:   1. Mastalgia - patient with mastalgia x 3 weeks. Has resolved.  She is overdue for breast screening with mammogram.  Will order.  2. Fatigue - unsure cause. May be secondary to hormonal changes, or nutritional deficiency. Will check hormone levels. Will also assess Vitamin D and TSH.  2. Abdominal bloating - patient with h/o endometriosis. Also reports a prior h/o ovarian cysts. Will assess remaining ovary for any masses. Pelvic ultrasound ordered.  3. Decreased sex drive - will assess hormone levels. Can supplement if deficient, otherwise can discuss other non-HRT options.  4. Stress incontinence - patient has been noting symptoms for almost 1 year. Discussed options for management, including conservative with Kegel exercises with/without pelvic floor physical therapy, pessary device, or surgical repair (likely sling placement).  Patient desires to  start with conservative management. Will f/u in 1 month to reassess symptoms.  5. Abnormal hair growth - mild hirsutism noted on today's exam. Will check testosterone levels.  Can discuss treatment with either shaving/plucking, hair depilatory creams/waxing, anti-androgen medications that slow hair growth.  6. Patient unsure if she has gone through menopause due to the cluster of her symptoms. Will assess hormone levels.  7. Patient needs pap smear, however unable to perform at this time due to discomfort of exam and location of cervix. Will attempt again once patient is no longer experiencing discomfort.   A total of 25 minutes were spent face-to-face with the patient during this encounter and over half of that time dealt with counseling and coordination of care.  Rubie Maid, MD [facility] 03/29/2019 12:07 AM

## 2019-03-31 LAB — PROGESTERONE: Progesterone: <0.1 ng/mL

## 2019-03-31 LAB — ESTRADIOL: Estradiol: 22.1 pg/mL

## 2019-03-31 LAB — TESTOSTERONE, FREE, TOTAL, SHBG
Sex Hormone Binding: 82.1 nmol/L (ref 24.6–122.0)
Testosterone, Free: 1.8 pg/mL (ref 0.0–4.2)
Testosterone: 31 ng/dL (ref 8–48)

## 2019-03-31 LAB — FSH/LH
FSH: 27.7 m[IU]/mL
LH: 11.4 m[IU]/mL

## 2019-04-02 LAB — VITAMIN D 25 HYDROXY (VIT D DEFICIENCY, FRACTURES): Vit D, 25-Hydroxy: 15.1 ng/mL — ABNORMAL LOW (ref 30.0–100.0)

## 2019-04-02 LAB — TSH: TSH: 1.12 u[IU]/mL (ref 0.450–4.500)

## 2019-04-02 LAB — SPECIMEN STATUS REPORT

## 2019-04-03 MED ORDER — VITAMIN D (ERGOCALCIFEROL) 1.25 MG (50000 UNIT) PO CAPS: 50000.0000 [IU] | ORAL_CAPSULE | ORAL | 0 refills | Status: DC

## 2019-04-03 NOTE — Addendum Note (Signed)
Addended by: Augusto Gamble on: 04/03/2019 04:24 PM   Modules accepted: Orders

## 2019-04-04 ENCOUNTER — Telehealth: Payer: Self-pay

## 2019-04-04 NOTE — Telephone Encounter (Signed)
Coronavirus (COVID-19) Are you at risk?  Are you at risk for the Coronavirus (COVID-19)?  To be considered HIGH RISK for Coronavirus (COVID-19), you have to meet the following criteria:  . Traveled to [place], [place], [place], [place] or [place]; or in the [place] to [place], [place], [place], or [place]; and have fever, cough, and shortness of breath within the last 2 weeks of travel OR . Been in close contact with a person diagnosed with COVID-19 within the last 2 weeks and have fever, cough, and shortness of breath . IF YOU DO NOT MEET THESE CRITERIA, YOU ARE CONSIDERED LOW RISK FOR COVID-19.  What to do if you are HIGH RISK for COVID-19?  . If you are having a medical emergency, call 911. . Seek medical care right away. Before you go to a doctor's office, urgent care or emergency department, call ahead and tell them about your recent travel, contact with someone diagnosed with COVID-19, and your symptoms. You should receive instructions from your physician's office regarding next steps of care.  . When you arrive at healthcare provider, tell the healthcare staff immediately you have returned from visiting [place], [place], [place], [place] or [place]; or traveled in the [place] to [place], [place], [place], or [place]; in the last two weeks or you have been in close contact with a person diagnosed with COVID-19 in the last 2 weeks.   . Tell the health care staff about your symptoms: fever, cough and shortness of breath. . After you have been seen by a medical provider, you will be either: o Tested for (COVID-19) and discharged home on quarantine except to seek medical care if symptoms worsen, and asked to  - Stay home and avoid contact with others until you get your results (4-5 days)  - Avoid travel on public transportation if possible (such as bus, train, or airplane) or o Sent to the Emergency Department by EMS for evaluation, COVID-19 testing, and possible  admission depending on your condition and test results.  What to do if you are LOW RISK for COVID-19?  Reduce your risk of any infection by using the same precautions used for avoiding the common cold or flu:  . Wash your hands often with soap and warm water for at least 20 seconds.  If soap and water are not readily available, use an alcohol-based hand sanitizer with at least 60% alcohol.  . If coughing or sneezing, cover your mouth and nose by coughing or sneezing into the elbow areas of your shirt or coat, into a tissue or into your sleeve (not your hands). . Avoid shaking hands with others and consider head nods or verbal greetings only. . Avoid touching your eyes, nose, or mouth with unwashed hands.  . Avoid close contact with people who are sick. . Avoid places or events with large numbers of people in one location, like concerts or sporting events. . Carefully consider travel plans you have or are making. . If you are planning any travel outside or inside the [place], visit the CDC's Travelers' Health webpage for the latest health notices. . If you have some symptoms but not all symptoms, continue to monitor at home and seek medical attention if your symptoms worsen. . If you are having a medical emergency, call 911.   ADDITIONAL HEALTHCARE OPTIONS FOR PATIENTS  [facility] / e-Visit: [email or web address]         [facility]: [phone]  [facility]: [phone]                   [facility]: [phone]   Prescreened. Neg .cm 

## 2019-04-05 ENCOUNTER — Ambulatory Visit (INDEPENDENT_AMBULATORY_CARE_PROVIDER_SITE_OTHER): Payer: BC Managed Care – PPO

## 2019-04-05 ENCOUNTER — Other Ambulatory Visit: Payer: Self-pay

## 2019-04-05 ENCOUNTER — Other Ambulatory Visit: Payer: Self-pay | Admitting: Obstetrics and Gynecology

## 2019-04-05 DIAGNOSIS — Z90710 Acquired absence of both cervix and uterus: Secondary | ICD-10-CM

## 2019-04-05 DIAGNOSIS — R140 Abdominal distension (gaseous): Secondary | ICD-10-CM | POA: Diagnosis not present

## 2019-04-25 ENCOUNTER — Encounter: Payer: Self-pay | Admitting: Obstetrics and Gynecology

## 2019-04-25 ENCOUNTER — Other Ambulatory Visit: Payer: Self-pay

## 2019-04-25 ENCOUNTER — Ambulatory Visit (INDEPENDENT_AMBULATORY_CARE_PROVIDER_SITE_OTHER): Payer: 59 | Admitting: Obstetrics and Gynecology

## 2019-04-25 VITALS — BP 122/76 | HR 80 | Ht 62.0 in | Wt 222.2 lb

## 2019-04-25 DIAGNOSIS — N951 Menopausal and female climacteric states: Secondary | ICD-10-CM

## 2019-04-25 DIAGNOSIS — N393 Stress incontinence (female) (male): Secondary | ICD-10-CM | POA: Diagnosis not present

## 2019-04-25 DIAGNOSIS — R69 Illness, unspecified: Secondary | ICD-10-CM | POA: Diagnosis not present

## 2019-04-25 DIAGNOSIS — Z8673 Personal history of transient ischemic attack (TIA), and cerebral infarction without residual deficits: Secondary | ICD-10-CM | POA: Diagnosis not present

## 2019-04-25 DIAGNOSIS — L689 Hypertrichosis, unspecified: Secondary | ICD-10-CM

## 2019-04-25 DIAGNOSIS — Z6841 Body Mass Index (BMI) 40.0 and over, adult: Secondary | ICD-10-CM | POA: Diagnosis not present

## 2019-04-25 DIAGNOSIS — Z86718 Personal history of other venous thrombosis and embolism: Secondary | ICD-10-CM

## 2019-04-25 DIAGNOSIS — R5383 Other fatigue: Secondary | ICD-10-CM | POA: Diagnosis not present

## 2019-04-25 DIAGNOSIS — E559 Vitamin D deficiency, unspecified: Secondary | ICD-10-CM

## 2019-04-25 DIAGNOSIS — R6882 Decreased libido: Secondary | ICD-10-CM | POA: Diagnosis not present

## 2019-04-25 MED ORDER — TESTOSTERONE CYPIONATE 200 MG/ML IM SOLN: 50.0000 mg | INTRAMUSCULAR | 0 refills | Status: DC

## 2019-04-25 NOTE — Progress Notes (Signed)
GYNECOLOGY PROGRESS NOTE  Subjective:    Patient ID: Sara Fitzpatrick, female    DOB: 01-16-1971, 48 y.o.   MRN: [id number]  HPI  Patient is a 48 y.o. G81P1011 female who presents for follow up.  Last visit patient noted complaints of mastalgia, fatigue, abdominal bloating, hot flushes, decreased sex drive, stress incontinence, and abnormal hair growth (facial hair noted mostly on chin, usually has ~ 5-6 hairs, has to pluck q 2-3 days). Had hormone levels checked last visit, which noted menopausal status and Vitamin D deficiency. Also had an ultrasound performed, with no significant findings.  She states she just began taking the Vitamin D supplementation last week.  Has also been trying to find some of the herbal remedies for hot flushes, but has not been successful so far. She does note some improvement In her stress incontinence with the use of Kegel exercises.   Of note, patient also desires to discuss weight loss.  States that she has been on phentermine in the past, and would like to resume as she has noted good success.  Has been trying to lose weight recently over the past month, and has made dietary lifestyle changes, but notes she gained 2 lbs since her last visit.  Has not yet begun an exercise regimen yet but plans to.   The following portions of the patient's history were reviewed and updated as appropriate: allergies, current medications, past family history, past medical history, past social history, past surgical history and problem list.  Review of Systems Pertinent items noted in HPI and remainder of comprehensive ROS otherwise negative.   Objective:   Blood pressure 122/76, pulse 80, height 5\' 2"  (1.575 m), weight 222 lb 3.2 oz (100.8 kg). Body mass index is 40.64 kg/m.  General appearance: alert and no distress Remainder of exam deferred.     Labs:  Office Visit on 03/28/2019  Component Date Value Ref Range Status  . LH 03/28/2019 11.4  mIU/mL Final   Comment:                      Adult Female:                       Follicular phase      2.4 -  12.6                       Ovulation phase      14.0 -  95.6                       Luteal phase          1.0 -  11.4                       Postmenopausal        7.7 -  58.5   . [facility] 03/28/2019 27.7  mIU/mL Final   Comment:                     Adult Female:                       Follicular phase      3.5 -  12.5                       Ovulation phase  4.7 -  21.5                       Luteal phase          1.7 -   7.7                       Postmenopausal       25.8 - 134.8   . Estradiol 03/28/2019 22.1  pg/mL Final   Comment:                     Adult Female:                       Follicular phase   12.5 -   166.0                       Ovulation phase    85.8 -   498.0                       Luteal phase       43.8 -   211.0                       Postmenopausal     <6.0 -    54.7                     Pregnancy                       1st trimester     215.0 - >4300.0                     Girls (1-10 years)    6.0 -    27.0 Roche ECLIA methodology   . Progesterone 03/28/2019 <0.1  ng/mL Final   Comment:                      Follicular phase       0.1 -   0.9                      Luteal phase           1.8 -  23.9                      Ovulation phase        0.1 -  12.0                      Pregnant                         First trimester    11.0 -  44.3                         Second trimester   25.4 -  83.3                         Third trimester    58.7 - 214.0                      Postmenopausal         0.0 -   0.1   .  Testosterone 03/28/2019 31  8 - 48 ng/dL Final  . Testosterone, Free 03/28/2019 1.8  0.0 - 4.2 pg/mL Final  . Sex Hormone Binding 03/28/2019 82.1  24.6 - 122.0 nmol/L Final  . TSH 03/28/2019 1.120  0.450 - 4.500 uIU/mL Final  . Vit D, 25-Hydroxy 03/28/2019 15.1* 30.0 - 100.0 ng/mL Final   Comment: Vitamin D deficiency has been defined by the Institute of Medicine and an Endocrine  Society practice guideline as a level of serum 25-OH vitamin D less than 20 ng/mL (1,2). The Endocrine Society went on to further define vitamin D insufficiency as a level between 21 and 29 ng/mL (2). 1. IOM (Institute of Medicine). 2010. Dietary reference    intakes for calcium and D. [place]: The    [employer]. 2. Holick MF, [place], Bischoff-Ferrari HA, et al.    Evaluation, treatment, and prevention of vitamin D    deficiency: an Endocrine Society clinical practice    guideline. JCEM. 2011 Jul; 96(7):1911-30.   Marland Kitchen. specimen status report 03/28/2019 Comment   Final   Comment: Written Authorization Written Authorization Written Authorization Received. [facility]ization [facility] from [facility] 04-02-2019 Logged by Lawernce PittsShelley Royer      Imaging:  US PELVIS (TRANSABDOMINAL ONLY) Patient Name: Sara Fitzpatrick DOB: 11-Nov-1970 MRN: [id number] ULTRASOUND REPORT  Location: [facility] Date of Service: 04/05/2019   Indications:Pelvic Pain Findings:  The uterus tissue is anteverted and measures 4.0 x 2.0 x 2.3cm. Echo texture is homogenous without evidence of focal masses.  The Endometrium measures 3 mm.  Right Ovary measures 1.4 x 1.9 x 1.2 cm. It is normal in appearance. Left Ovary measures 1.4 x 1.1 x 0.6 cm. It is normal in appearance. Survey of the adnexa demonstrates no adnexal masses. There is no free fluid in the cul de sac.  Impression: 1.Bilateral Ovaries appear to be atrophied. 2.Uterine tissue was seen S/P hysterectomy.  Recommendations: 1.Clinical correlation with the patient's History and Physical Exam.  Jenine M. Marciano SequinAlessi    RDMS  I have reviewed this study and agree with documented findings.   Hildred LaserAnika Koden Hunzeker, MD [facility]    Assessment:   Menopausal vasomotor syndrome  Excessive hair growth  Decreased libido  History of DVT (deep vein thrombosis)  History of stroke Stress incontinence Vitamin D  deficiency Fatigue, unspecified type  Morbid obesity with BMI of 40.0-44.9, adult (HCC)   Plan:   1. Patient with bothersome menopausal vasomotor symptoms. Discussed lifestyle interventions such as wearing light clothing, remaining in cool environments, having fan/air conditioner in the room, avoiding hot beverages etc.  Discussed using hormone therapy and concerns about increased risk of heart disease, cerebrovascular disease, thromboembolic disease,  and breast cancer.  Also discussed other medical options such as Paxil, Effexor, Brisdelle, Clonidine,  or Neurontin.   Also discussed alternative therapies such as herbal remedies but cautioned that most of the products contained phytoestrogens (plant estrogens) in unregulated amounts which can have the same effects on the body as the pharmaceutical estrogen preparations. Would recommend her not taking HRT due to history of DVT and stroke.  2. Vitamin D deficiency, patient currently taking Vitamin D high dose supplementation.  3.Decreased libido.  Discussed options for patient, including natural remedies, testosterone injections. Would not recommend HRT due to h/o stroke/DVT.  Patient notes she will try testosterone injections.  4. Stress incontinence improving some with kegel exercises.  Will continue use.  5. Increased hair growth, patient notes plucking q 2-3 times per  week.  Note that testosterone may increase hair growth.  Patient notes she will wait to see what happens.  6. Desires weight loss management. Discussed risks of use of Phentermine s/p history of stroke/DVT.  Patient notes use of Phentermine since history of stroke. Advised to seek advice from PCP first. Needs lipid levels and A1c. Notes that she will have labs drawn by them.   RTC in 1-2 weeks for testosterone injection.     A total of 15 minutes were spent face-to-face with the patient during this encounter and over half of that time dealt with counseling and coordination of care.     Hildred Laserherry, Jarrette Dehner, MD [facility]

## 2019-04-25 NOTE — Progress Notes (Signed)
Pt stated that she has been working on the advise given by provider on last visit. Pt stated that she really has been trying to loss weight without any success.

## 2019-05-18 ENCOUNTER — Ambulatory Visit: Payer: 59

## 2019-05-18 ENCOUNTER — Other Ambulatory Visit: Payer: Self-pay

## 2019-05-18 ENCOUNTER — Other Ambulatory Visit: Payer: 59

## 2019-07-13 ENCOUNTER — Other Ambulatory Visit: Payer: Self-pay | Admitting: Obstetrics and Gynecology

## 2019-07-13 DIAGNOSIS — E559 Vitamin D deficiency, unspecified: Secondary | ICD-10-CM

## 2019-08-30 ENCOUNTER — Ambulatory Visit: Payer: Managed Care, Other (non HMO) | Admitting: Registered Nurse

## 2019-08-30 ENCOUNTER — Other Ambulatory Visit: Payer: Self-pay

## 2019-08-30 VITALS — BP 144/86 | HR 94 | Temp 98.0°F | Resp 12 | Ht 63.0 in | Wt 232.0 lb

## 2019-08-30 DIAGNOSIS — F418 Other specified anxiety disorders: Secondary | ICD-10-CM

## 2019-08-30 MED ORDER — BUSPIRONE HCL 10 MG PO TABS: 10.0000 mg | ORAL_TABLET | Freq: Three times a day (TID) | ORAL | 0 refills | Status: AC | PRN

## 2019-08-30 NOTE — Patient Instructions (Signed)

## 2019-08-30 NOTE — Progress Notes (Signed)
Work stress & family stress.  AMD

## 2019-08-30 NOTE — Progress Notes (Signed)
Subjective:    Patient ID: Sara Fitzpatrick, female    DOB: 1971-01-26, 48 y.o.   MRN: [id number]  48y/o married caucasian female established patient last seen by Dr Cheryll Cockayne 03/29/2018.  Here to discuss restarting anxiety medication but doesn't want a daily med at this time.  Was on buspirone 50m po TID prn at that time typically using TID taking every day not prn.  Had also been on prozac 210mpo qam due to menopause but stopped use in 2018 didn't help anxiety. Concerned because she had panic attack/crying couldn't stop and usually her mindfulness exercises work.  Also recently 1369/o daughter had suicide attempt and has been hospitalized coming home from inpatient to outpatient therapy tomorrow and she will be taking time off from work FMLA to care for daughter; daughter with eating disorder recently lost a great deal of weight and she has many restrictions on her diet and activity; patient's mother chronic kidney disease and almost died two weeks ago and patient is the [id number]) and recently had CLIA inspection that they passed but was causing a lot of stress.  Not exercising.  Using mindful techniques.  Typically has support at church but due to covid restrictions/pandemic hasn't been in person church since Mar 2020.  Is participating in family therapy due to daughter suicide attempt.  Patient used celexa after daughter born for post partum depression with good results also.  Patient denied HI/SI.  Sleeping on average 3 hours per night and a good night if she gets 4 hours.  GAD-7 completed.  Side effects with cymbalta and lyrica for fibromyalgia patient does not want these medications again.     Review of Systems  Constitutional: Negative for activity change, appetite change, chills, diaphoresis, fatigue and fever.  HENT: Negative for trouble swallowing and voice change.   Eyes: Negative for photophobia and visual disturbance.   Respiratory: Negative for shortness of breath, wheezing and stridor.   Cardiovascular: Negative for chest pain.  Gastrointestinal: Negative for abdominal pain, diarrhea, nausea and vomiting.  Endocrine: Negative for cold intolerance and heat intolerance.  Genitourinary: Negative for difficulty urinating.  Musculoskeletal: Negative for gait problem, neck pain and neck stiffness.  Skin: Negative for rash.  Allergic/Immunologic: Negative for environmental allergies and food allergies.  Neurological: Negative for dizziness, tremors, seizures, syncope, facial asymmetry, speech difficulty, weakness, light-headedness, numbness and headaches.  Hematological: Negative for adenopathy. Does not bruise/bleed easily.  Psychiatric/Behavioral: Positive for sleep disturbance. Negative for confusion, hallucinations, self-injury and suicidal ideas. The patient is nervous/anxious.        Objective:   Physical Exam Vitals signs and nursing note reviewed.  Constitutional:      General: She is awake.     Appearance: Normal appearance. She is well-developed and well-groomed. She is obese. She is not ill-appearing, toxic-appearing or diaphoretic.  HENT:     Head: Normocephalic and atraumatic.     Jaw: There is normal jaw occlusion.     Right Ear: Hearing and external ear normal.     Left Ear: Hearing and external ear normal.     Nose: Nose normal.     Mouth/Throat:     Mouth: Mucous membranes are moist.     Pharynx: Oropharynx is clear.  Eyes:     General: Lids are normal. Vision grossly intact. Gaze aligned appropriately. No visual field deficit or scleral icterus.    Extraocular Movements: Extraocular movements intact.     Conjunctiva/sclera: Conjunctivae  normal.     Pupils: Pupils are equal, round, and reactive to light.  Neck:     Musculoskeletal: Normal range of motion and neck supple.     Trachea: Trachea and phonation normal.  Cardiovascular:     Rate and Rhythm: Normal rate and regular  rhythm.     Pulses: Normal pulses.     Heart sounds: Normal heart sounds.  Pulmonary:     Effort: Pulmonary effort is normal.     Breath sounds: Normal breath sounds and air entry. No stridor or decreased air movement. No decreased breath sounds, wheezing, rhonchi or rales.     Comments: Wearing cloth mask due to covid 19 pandemic Abdominal:     Palpations: Abdomen is soft.  Musculoskeletal: Normal range of motion.     Right shoulder: Normal.     Left shoulder: Normal.     Right elbow: Normal.    Left elbow: Normal.     Right hip: Normal.     Left hip: Normal.     Right knee: Normal.     Left knee: Normal.     Right hand: Normal.     Left hand: Normal.  Lymphadenopathy:     Head:     Right side of head: No submental, submandibular, tonsillar, preauricular, posterior auricular or occipital adenopathy.     Left side of head: No submental, submandibular, tonsillar, preauricular, posterior auricular or occipital adenopathy.     Cervical: No cervical adenopathy.     Right cervical: No superficial cervical adenopathy.    Left cervical: No superficial cervical adenopathy.  Skin:    General: Skin is warm and dry.     Capillary Refill: Capillary refill takes less than 2 seconds.     Coloration: Skin is not ashen, cyanotic, jaundiced, mottled, pale or sallow.     Findings: No bruising or ecchymosis.     Nails: There is no clubbing.   Neurological:     General: No focal deficit present.     Mental Status: She is alert and oriented to person, place, and time. Mental status is at baseline.     GCS: GCS eye subscore is 4. GCS verbal subscore is 5. GCS motor subscore is 6.     Cranial Nerves: Cranial nerves are intact. No cranial nerve deficit, dysarthria or facial asymmetry.     Sensory: Sensation is intact. No sensory deficit.     Motor: Motor function is intact. No weakness, tremor, atrophy, abnormal muscle tone or seizure activity.     Coordination: Coordination is intact. Coordination  normal.     Gait: Gait is intact. Gait normal.     Comments: On/off exam table without difficulty; gait sure and steady in hallway  Psychiatric:        Attention and Perception: Attention and perception normal.        Mood and Affect: Mood and affect normal.        Speech: Speech normal.        Behavior: Behavior normal. Behavior is cooperative.        Thought Content: Thought content normal.        Cognition and Memory: Cognition and memory normal.        Judgment: Judgment normal.     Comments: Calculator: GAD-7 anxiety scale in adults     Over the last two weeks, how often have you been bothered by the following problems?   Feeling nervous, anxious, or on edge 1     Not  being able to stop or control worrying 3     Worrying too much about different things 3     Trouble relaxing 3     Being so restless that it is hard to sit still 1     Becoming easily annoyed or irritable 1     Feeling afraid as if something awful might happen 1        Total criteria point count:  13    GAD-7 Interpretation   0 to 4 points: Minimal or no anxiety 5 to 9 points: Mild anxiety 10 to 14 points: Moderate anxiety 15 to 21 points: Severe anxiety     several days 1 Nearly every day 3        Assessment & Plan:  A-situational stressors  P-patient wants to restart prn anxiety medication.  Previously on buspar 18m po TID prn with good results taking typically daily TID.  Electronic Rx #[id number] sent to her pharmacy of choice and patient to follow up in 1 month for re-evaluation sooner if new or worsening symptoms.  Discussed intake caffeine, no exercise, too little sleep can worsen anxiety symptoms also.  Continue family counseling and consider individual counseling.  May use 1-800 helpline to talk with trained counselors also in the middle of the night if covid coach, virtual hope box, mood fit or insight timer free apps for cell phone/computer not helping.  Denied HI/SI.   Exitcare handout printed and given on living with anxiety. Discussed using meditation app to help her fall asleep instead of focusing on what is worrying here from the day.  Discussed since she cannot do formal exercise at home consider housework e.g. laundry, dishes, walking room to room to "get needed items" help to burn off stress hormones.  Consider stretching/tai chi/yoga free online videos.  ER open also 24/7 if thoughts of harming herself or others or if daughter needs immediate assistance and provider not available per daughter provider after hours care guidelines given to family.  Patient verbalized understanding information/instructions, agreed with plan of care and had no further questions at this time.

## 2019-09-19 DIAGNOSIS — Z20828 Contact with and (suspected) exposure to other viral communicable diseases: Secondary | ICD-10-CM | POA: Diagnosis not present

## 2019-09-20 ENCOUNTER — Other Ambulatory Visit: Payer: Self-pay

## 2019-09-20 DIAGNOSIS — Z20822 Contact with and (suspected) exposure to covid-19: Secondary | ICD-10-CM

## 2019-09-22 LAB — NOVEL CORONAVIRUS, NAA: SARS-CoV-2, NAA: NOT DETECTED

## 2019-10-11 ENCOUNTER — Other Ambulatory Visit: Payer: Self-pay

## 2019-10-11 ENCOUNTER — Ambulatory Visit: Payer: Managed Care, Other (non HMO) | Admitting: Physician Assistant

## 2019-10-11 VITALS — BP 130/82 | HR 88 | Temp 98.3°F | Resp 12 | Ht 63.0 in | Wt 233.0 lb

## 2019-10-11 DIAGNOSIS — F418 Other specified anxiety disorders: Secondary | ICD-10-CM

## 2019-10-11 MED ORDER — BUSPIRONE HCL 10 MG PO TABS: 10.0000 mg | ORAL_TABLET | Freq: Three times a day (TID) | ORAL | 1 refills | Status: DC

## 2019-10-11 NOTE — Progress Notes (Signed)
Can definitley tell when I don't take the medicine. States I've done really well. Most days take 2 tablets & a few days has had to take 3 tabs/day.  AMD

## 2019-10-11 NOTE — Progress Notes (Signed)
   Subjective: Medication refill    Patient ID: Sara Fitzpatrick, female    DOB: Apr 11, 1971, 48 y.o.   MRN: [id number]  HPI Patient presents with request for medication refill of BuSpar for anxiety/depression.  Patient stated since starting medication she has noticed a difference in her daily activities and affect.   Review of Systems    Negative except for complaint. Objective:   Physical Exam Deferred       Assessment & Plan: Anxiety  Refill medication BuSpar take as directed.  Follow-up in 2 months.

## 2019-10-19 DIAGNOSIS — R69 Illness, unspecified: Secondary | ICD-10-CM | POA: Diagnosis not present

## 2019-10-20 DIAGNOSIS — R69 Illness, unspecified: Secondary | ICD-10-CM | POA: Diagnosis not present

## 2019-10-22 DIAGNOSIS — R69 Illness, unspecified: Secondary | ICD-10-CM | POA: Diagnosis not present

## 2019-10-23 ENCOUNTER — Encounter: Payer: Self-pay | Admitting: Occupational Medicine

## 2019-10-23 ENCOUNTER — Ambulatory Visit: Payer: Managed Care, Other (non HMO) | Admitting: Occupational Medicine

## 2019-10-23 ENCOUNTER — Other Ambulatory Visit: Payer: Self-pay

## 2019-10-23 VITALS — BP 152/84 | HR 106 | Temp 97.7°F | Ht 63.0 in | Wt 232.0 lb

## 2019-10-23 DIAGNOSIS — Z8489 Family history of other specified conditions: Secondary | ICD-10-CM

## 2019-10-23 DIAGNOSIS — I10 Essential (primary) hypertension: Secondary | ICD-10-CM

## 2019-10-23 DIAGNOSIS — F411 Generalized anxiety disorder: Secondary | ICD-10-CM

## 2019-10-23 MED ORDER — AMLODIPINE BESYLATE 5 MG PO TABS: 5.0000 mg | ORAL_TABLET | Freq: Every day | ORAL | 0 refills | Status: DC

## 2019-10-23 NOTE — Patient Instructions (Signed)
Thank you for visiting [place] [facility].  You were seen today for: 1. Hypertension, unspecified type - amLODipine (NORVASC) 5 MG tablet; Take 1 tablet (5 mg total) by mouth daily.  Dispense: 90 tablet; Refill: 0  2. Anxiety state  3. Family health problem  Start new blood pressure medication, Amlodipine (Norvasc). Take one tablet once a day. Recommend taking medication at night time.  Continue to monitor blood pressure. Keep a log/record.  Recommend continuing to decrease salt, watch carbs, and refrain from alcohol. Try and add some exercise in to your regimen; recommend walking.  Continue Buspar as prescribed.  Recommend continuing regular visits with family counselor/therapist.  Follow-up at [facility] in 4 weeks for re-evaluation.   Hypertension, Adult Hypertension is another name for high blood pressure. High blood pressure forces your heart to work harder to pump blood. This can cause problems over time. There are two numbers in a blood pressure reading. There is a top number (systolic) over a bottom number (diastolic). It is best to have a blood pressure that is below 120/80. Healthy choices can help lower your blood pressure, or you may need medicine to help lower it. What are the causes? The cause of this condition is not known. Some conditions may be related to high blood pressure. What increases the risk?  Smoking.  Having type 2 diabetes mellitus, high cholesterol, or both.  Not getting enough exercise or physical activity.  Being overweight.  Having too much fat, sugar, calories, or salt (sodium) in your diet.  Drinking too much alcohol.  Having long-term (chronic) kidney disease.  Having a family history of high blood pressure.  Age. Risk increases with age.  Race. You may be at higher risk if you are African American.  Gender. Men are at higher risk than women before age 46. After age 8,  women are at higher risk than men.  Having obstructive sleep apnea.  Stress. What are the signs or symptoms?  High blood pressure may not cause symptoms. Very high blood pressure (hypertensive crisis) may cause: ? Headache. ? Feelings of worry or nervousness (anxiety). ? Shortness of breath. ? Nosebleed. ? A feeling of being sick to your stomach (nausea). ? Throwing up (vomiting). ? Changes in how you see. ? Very bad chest pain. ? Seizures. How is this treated?  This condition is treated by making healthy lifestyle changes, such as: ? Eating healthy foods. ? Exercising more. ? Drinking less alcohol.  Your health care provider may prescribe medicine if lifestyle changes are not enough to get your blood pressure under control, and if: ? Your top number is above 130. ? Your bottom number is above 80.  Your personal target blood pressure may vary. Follow these instructions at home: Eating and drinking   If told, follow the DASH eating plan. To follow this plan: ? Fill one half of your plate at each meal with fruits and vegetables. ? Fill one fourth of your plate at each meal with whole grains. Whole grains include whole-wheat pasta, brown rice, and whole-grain bread. ? Eat or drink low-fat dairy products, such as skim milk or low-fat yogurt. ? Fill one fourth of your plate at each meal with low-fat (lean) proteins. Low-fat proteins include fish, chicken without skin, eggs, beans, and tofu. ? Avoid fatty meat, cured and processed meat, or chicken with skin. ? Avoid pre-made or processed food.  Eat less than 1,500 mg of salt each day.  Do not drink alcohol if: ? Your doctor tells you not to drink. ? You are pregnant, may be pregnant, or are planning to become pregnant.  If you drink alcohol: ? Limit how much you use to:  0-1 drink a day for women.  0-2 drinks a day for men. ? Be aware of how much alcohol is in your drink. In the [place] one drink equals one 12 oz bottle  of beer (355 mL), one 5 oz glass of wine (148 mL), or one 1 oz glass of hard liquor (44 mL). Lifestyle   Work with your doctor to stay at a healthy weight or to lose weight. Ask your doctor what the best weight is for you.  Get at least 30 minutes of exercise most days of the week. This may include walking, swimming, or biking.  Get at least 30 minutes of exercise that strengthens your muscles (resistance exercise) at least 3 days a week. This may include lifting weights or doing Pilates.  Do not use any products that contain nicotine or tobacco, such as cigarettes, e-cigarettes, and chewing tobacco. If you need help quitting, ask your doctor.  Check your blood pressure at home as told by your doctor.  Keep all follow-up visits as told by your doctor. This is important. Medicines  Take over-the-counter and prescription medicines only as told by your doctor. Follow directions carefully.  Do not skip doses of blood pressure medicine. The medicine does not work as well if you skip doses. Skipping doses also puts you at risk for problems.  Ask your doctor about side effects or reactions to medicines that you should watch for. Contact a doctor if you:  Think you are having a reaction to the medicine you are taking.  Have headaches that keep coming back (recurring).  Feel dizzy.  Have swelling in your ankles.  Have trouble with your vision. Get help right away if you:  Get a very bad headache.  Start to feel mixed up (confused).  Feel weak or numb.  Feel faint.  Have very bad pain in your: ? Chest. ? Belly (abdomen).  Throw up more than once.  Have trouble breathing. Summary  Hypertension is another name for high blood pressure.  High blood pressure forces your heart to work harder to pump blood.  For most people, a normal blood pressure is less than 120/80.  Making healthy choices can help lower blood pressure. If your blood pressure does not get lower with  healthy choices, you may need to take medicine. This information is not intended to replace advice given to you by your health care provider. Make sure you discuss any questions you have with your health care provider. Document Revised: 06/15/2018 Document Reviewed: 06/15/2018 Elsevier Patient Education  2020 [employer].

## 2019-10-23 NOTE — Progress Notes (Signed)
BP elevations started 10/18/2019. 10/18/19 -186/110 ^6:00 pm) 10/20/19 - 180/100 (11:00 am) 10/20/19 - 166/998 (7:20 66m) 01/02 /21 - 186/114 (5:00 pm) 10/22/19 - 178/110 (6:00 pm) 10/22/19 - 180/100 (8:00 pm) 10/23/19 - 170/100 (8:00 am) 10/23/19 - 152/100 (11:50 am) 10/23/19 - 135/80 (12:45 pm) 10/23/19 - 124/75 (12:48 pm)  Has felt dizzy, light headed & chest discomfort intermittently since last week.  Feels like I have a huge gas bubble in my chest & have been drinking carbonated drinks to try to make myself belch.  Really feel like it's stress related.  States husband (EMT) had been checking in Right arm.  Rechecked BP with her lying in the right arm & BP was 130/80.Marland Kitchen  Home stress.  AMD

## 2019-10-23 NOTE — Progress Notes (Signed)
Patient ID: Sara Fitzpatrick DOB: 01-19-1971 AGE: 49 y.o. MRN: [id number]   PCP: No primary care provider on file.   Chief Complaint:  Chief Complaint  Patient presents with  . Blood Pressure Check  . Covid Screening     Subjective:    HPI:  Sara Fitzpatrick is a 49 y.o. female presents for evaluation  Chief Complaint  Patient presents with  . Blood Pressure Check  . Covid Screening    49 year old female presents to [place] [facility] with concerns of elevated blood pressure. Patient with several blood pressure readings from home; typically 150-180s/90-110s. Reports associated headache. Patient also with 1-1/2 week history of intermittent chest pain; describes as tightness, suspects related to her anxiety. Patient with a 48 year old daughter currently undergoing treatment for an eating disorder and self-harming behavior; yesterday was hospitalized for inpatient treatment at facility in [place]. Patient states she is very emotional with frequent crying, having difficulty sleeping, and having difficulty focusing. Feels Buspar is helping. Denies homicidal or suicidal ideation. Denies dyspnea, SOB, SOB with exertion, peripheral edema.  Patient seen at [facility] on 10/11/2019 for refill of Buspar, prescribed for situational anxiety. BP at that time 130/82.  Patient seen at [facility] on 08/30/2019 to restart Buspar, prescribed for situational anxiety. BP at that time 144/86.  Patient seen at [facility] ED for atypical chest pain and palpitations. Followed up with cardiology, Dr. Bartholome Bill MD with [facility] (seen on 04/15/18). Normal echocardiogram, EKG, and stress test.  Patient has also previously been seen by Dr. Hortencia Pilar MD with [facility] for Raynauds. At that time, patient was advised to continue Norvasc.  Last BUN and Cr; 07/13/18: 11 and 0.91 respectively. GFR >60. UA on 10/24/17 revealed no  protein.  A limited review of symptoms was performed, pertinent positives and negatives as mentioned in HPI.  The following portions of the patient's history were reviewed and updated as appropriate: allergies, current medications and past medical history.  Patient Active Problem List   Diagnosis Date Noted  . Chest pain at rest 04/24/2018  . Ischemic finger 06/03/2017  . Embolism and thrombosis of arteries of upper extremities ([place]) 06/03/2017  . Pain in finger of right hand 06/03/2017  . Raynaud phenomenon 06/03/2017  . History of cesarean section 09/01/2016  . Status post laparoscopic supracervical hysterectomy 09/01/2016  . Pelvic pain 09/01/2016  . Endometriosis 09/01/2016    Allergies  Allergen Reactions  . Other Anaphylaxis    paprika  . Hydrocodone-Acetaminophen Itching  . Morphine And Related Other (See Comments)    hypotensive    Current Outpatient Medications on File Prior to Visit  Medication Sig Dispense Refill  . aspirin EC 81 MG tablet Take 81 mg by mouth daily.    . Aspirin-Salicylamide-Caffeine (BC HEADACHE POWDER PO) Take 1 Package by mouth as needed (migraines).    . busPIRone (BUSPAR) 10 MG tablet Take 1 tablet (10 mg total) by mouth 3 (three) times daily as needed. 90 tablet 0  . busPIRone (BUSPAR) 10 MG tablet Take 1 tablet (10 mg total) by mouth 3 (three) times daily. 90 tablet 1  . Calcium 600-200 MG-UNIT tablet Take 1 tablet by mouth daily.    . Vitamin D, Ergocalciferol, (DRISDOL) 1.25 MG (50000 UT) CAPS capsule Take 1 capsule (50,000 Units total) by mouth every 7 (seven) days. 12 capsule 0   No current facility-administered medications on file prior to visit.  Objective:   Vitals:   10/23/19 1323  BP: (!) 152/84  Pulse: (!) 106  Temp: 97.7 F (36.5 C)  SpO2: 97%     Wt Readings from Last 3 Encounters:  10/23/19 232 lb (105.2 kg)  10/11/19 233 lb (105.7 kg)  08/30/19 232 lb (105.2 kg)    Physical Exam:   General Appearance:   Patient sitting comfortably on examination table. Conversational. Peri [place] self-historian. In no acute distress. Afebrile.   Patient very emotional. Cried during examination with nurse and with provider/PA-C.   Head:  Normocephalic, without obvious abnormality, atraumatic  Eyes:  PERRL, conjunctiva/corneas clear, EOM's intact  Neck: Supple, symmetrical, trachea midline, no adenopathy. No carotid bruit.  Lungs:   Clear to auscultation bilaterally, respirations unlabored. Good aeration. No rales, rhonchi, crackles or wheezing.  Heart:  Regular rate and rhythm, S1 and S2 normal, no murmur, rub, or gallop  Extremities: Extremities normal, atraumatic, no cyanosis or edema No peripheral edema.  Pulses: 2+ and symmetric  Skin: Skin color, texture, turgor normal, no rashes or lesions Patient's hands with diffuse purple discoloration; patient states per her normal in regards to Raynauds.  Lymph nodes: Cervical, supraclavicular, and axillary nodes normal  Neurologic: Normal    Assessment & Plan:    Exam findings, diagnosis etiology and medication use and indications reviewed with patient. Follow-Up and discharge instructions provided. No emergent/urgent issues found on exam.  Patient education was provided.   Patient verbalized understanding of information provided and agrees with plan of care (POC), all questions answered. The patient is advised to call or return to clinic if condition does not see an improvement in symptoms, or to seek the care of the closest emergency department if condition worsens with the below plan.   1. Hypertension, unspecified type - amLODipine (NORVASC) 5 MG tablet; Take 1 tablet (5 mg total) by mouth daily.  Dispense: 90 tablet; Refill: 0  2. Anxiety state  3. Family health problem  Patient with several month/year history of mildly elevated BP. Over past 1-2 weeks, symptomatic (with associated headache; however, could be related to stress/anxiety and sleep deprivation)  and significant elevation (max 180/110).  At this time, will start patient on Norvasc 5mg  qhs. Patient has previously tolerated Norvasc well; was on for HTN and Raynauds. Patient with semi-recent cardiac evaluation, WNL (2019).  Discussed diet and exercise changes.  Advised patient continue Buspar as prescribed. Advised patient continue to see therapist/counselor.  Patient scheduled to return to clinic in 4 weeks for re-evaluation of HTN and anxiety.  Advised patient to go directly to the ED if she develops worsening chest pain, SOB/dyspnea, or other new/concerning symptom.   04-26-1999, MHS, PA-C Janalyn Harder, MHS, PA-C Advanced Practice Provider [facility]  [id number] [place], [place] (p): [phone] [email or web address]

## 2019-10-24 DIAGNOSIS — R69 Illness, unspecified: Secondary | ICD-10-CM | POA: Diagnosis not present

## 2019-10-27 DIAGNOSIS — R69 Illness, unspecified: Secondary | ICD-10-CM | POA: Diagnosis not present

## 2019-11-01 DIAGNOSIS — R69 Illness, unspecified: Secondary | ICD-10-CM | POA: Diagnosis not present

## 2019-11-03 DIAGNOSIS — R69 Illness, unspecified: Secondary | ICD-10-CM | POA: Diagnosis not present

## 2019-11-08 DIAGNOSIS — R69 Illness, unspecified: Secondary | ICD-10-CM | POA: Diagnosis not present

## 2019-11-13 DIAGNOSIS — Z03818 Encounter for observation for suspected exposure to other biological agents ruled out: Secondary | ICD-10-CM | POA: Diagnosis not present

## 2019-11-13 DIAGNOSIS — R05 Cough: Secondary | ICD-10-CM | POA: Diagnosis not present

## 2019-11-13 DIAGNOSIS — J40 Bronchitis, not specified as acute or chronic: Secondary | ICD-10-CM | POA: Diagnosis not present

## 2019-11-15 DIAGNOSIS — J40 Bronchitis, not specified as acute or chronic: Secondary | ICD-10-CM | POA: Diagnosis not present

## 2019-11-15 DIAGNOSIS — R05 Cough: Secondary | ICD-10-CM | POA: Diagnosis not present

## 2019-11-15 DIAGNOSIS — Z03818 Encounter for observation for suspected exposure to other biological agents ruled out: Secondary | ICD-10-CM | POA: Diagnosis not present

## 2019-11-20 DIAGNOSIS — R69 Illness, unspecified: Secondary | ICD-10-CM | POA: Diagnosis not present

## 2020-01-17 ENCOUNTER — Other Ambulatory Visit: Payer: Self-pay

## 2020-01-17 ENCOUNTER — Emergency Department
Admission: EM | Admit: 2020-01-17 | Discharge: 2020-01-17 | Disposition: A | Discharge status: Home / Self Care | Payer: 59 | Attending: Emergency Medicine | Admitting: Emergency Medicine

## 2020-01-17 ENCOUNTER — Encounter: Payer: Self-pay | Admitting: Registered Nurse

## 2020-01-17 ENCOUNTER — Telehealth: Payer: Self-pay | Admitting: Registered Nurse

## 2020-01-17 ENCOUNTER — Emergency Department: Payer: 59

## 2020-01-17 DIAGNOSIS — R0789 Other chest pain: Secondary | ICD-10-CM | POA: Diagnosis not present

## 2020-01-17 DIAGNOSIS — F17290 Nicotine dependence, other tobacco product, uncomplicated: Secondary | ICD-10-CM | POA: Diagnosis not present

## 2020-01-17 DIAGNOSIS — I10 Essential (primary) hypertension: Secondary | ICD-10-CM

## 2020-01-17 DIAGNOSIS — R079 Chest pain, unspecified: Secondary | ICD-10-CM

## 2020-01-17 LAB — COMPREHENSIVE METABOLIC PANEL
ALT: 19 U/L (ref 0–44)
AST: 22 U/L (ref 15–41)
Albumin: 4.2 g/dL (ref 3.5–5.0)
Alkaline Phosphatase: 64 U/L (ref 38–126)
Anion gap: 10 (ref 5–15)
BUN: 13 mg/dL (ref 6–20)
CO2: 25 mmol/L (ref 22–32)
Calcium: 8.9 mg/dL (ref 8.9–10.3)
Chloride: 105 mmol/L (ref 98–111)
Creatinine, Ser: 1.17 mg/dL — ABNORMAL HIGH (ref 0.44–1.00)
GFR calc Af Amer: >60 mL/min (ref >60)
GFR calc non Af Amer: 55 mL/min — ABNORMAL LOW (ref >60)
Glucose, Bld: 106 mg/dL — ABNORMAL HIGH (ref 70–99)
Potassium: 3.5 mmol/L (ref 3.5–5.1)
Sodium: 140 mmol/L (ref 135–145)
Total Bilirubin: 0.6 mg/dL (ref 0.3–1.2)
Total Protein: 7.5 g/dL (ref 6.5–8.1)

## 2020-01-17 LAB — CBC
HCT: 41.7 % (ref 36.0–46.0)
Hemoglobin: 13.9 g/dL (ref 12.0–15.0)
MCH: 30.6 pg (ref 26.0–34.0)
MCHC: 33.3 g/dL (ref 30.0–36.0)
MCV: 91.9 fL (ref 80.0–100.0)
Platelets: 276 10*3/uL (ref 150–400)
RBC: 4.54 MIL/uL (ref 3.87–5.11)
RDW: 13.3 % (ref 11.5–15.5)
WBC: 7.2 10*3/uL (ref 4.0–10.5)
nRBC: 0 % (ref 0.0–0.2)

## 2020-01-17 LAB — TROPONIN I (HIGH SENSITIVITY)
Troponin I (High Sensitivity): 5 ng/L (ref <18)
Troponin I (High Sensitivity): 4 ng/L (ref <18)

## 2020-01-17 MED ORDER — LORAZEPAM 1 MG PO TABS
1.0000 mg | ORAL_TABLET | Freq: Once | ORAL | Status: AC
  Administered 2020-01-17: 1 mg via ORAL
  Filled 2020-01-17: qty 1

## 2020-01-17 MED ORDER — AMLODIPINE BESYLATE 5 MG PO TABS: 5.0000 mg | ORAL_TABLET | Freq: Every day | ORAL | 0 refills | Status: DC

## 2020-01-17 MED ORDER — LORAZEPAM 0.5 MG PO TABS: 0.5000 mg | ORAL_TABLET | Freq: Three times a day (TID) | ORAL | 0 refills | Status: DC | PRN

## 2020-01-17 MED ORDER — BUSPIRONE HCL 10 MG PO TABS: 10.0000 mg | ORAL_TABLET | Freq: Three times a day (TID) | ORAL | 0 refills | Status: DC

## 2020-01-17 NOTE — ED Triage Notes (Signed)
Pt in with co midsternal chest pain that started mid day today. States feels like tightness and radiates to left chest. Pt denies any hx of heart disease. Pt denies any recent illness, just felt tired today.

## 2020-01-17 NOTE — ED Notes (Signed)
Pt reports chest pressure for 1 day.  Hx anxiety.  No chest pain or sob.  Denies n/v.  Pt alert   Speech clear.  md at bedside on arrival to treatment room.

## 2020-01-17 NOTE — Telephone Encounter (Signed)
Noted appt scheduled to be seen tomorrow for re-evaluation leg swelling.  May need change in BP meds.  Frequently seen with amlodipine leg swelling side effect.

## 2020-01-17 NOTE — ED Provider Notes (Signed)
[facility] Emergency Department Provider Note       Time seen: ----------------------------------------- 10:09 PM on 01/17/2020 -----------------------------------------   I have reviewed the triage vital signs and the nursing notes.  HISTORY   Chief Complaint Chest Pain    HPI Sara Fitzpatrick is a 49 y.o. female with a history of endometriosis, fibromyalgia, migraine, CVA who presents to the ED for midsternal chest pain that started midday today.  Patient states she feels like she has tightness that radiates to the left chest.  She denies any history of heart disease.  Past Medical History:  Diagnosis Date  . Blood clot in vein    right arm  . Endometriosis   . Fibromyalgia   . Migraine   . Pelvic pain   . Stroke (cerebrum) [facility])     Patient Active Problem List   Diagnosis Date Noted  . Chest pain at rest 04/24/2018  . Ischemic finger 06/03/2017  . Embolism and thrombosis of arteries of upper extremities ([place]) 06/03/2017  . Pain in finger of right hand 06/03/2017  . Raynaud phenomenon 06/03/2017  . History of cesarean section 09/01/2016  . Status post laparoscopic supracervical hysterectomy 09/01/2016  . Pelvic pain 09/01/2016  . Endometriosis 09/01/2016    Past Surgical History:  Procedure Laterality Date  . ABDOMINAL HYSTERECTOMY     lsh  . CESAREAN SECTION    . LAPAROSCOPY     x 3    Allergies Other, Hydrocodone-acetaminophen, and Morphine and related  Social History Social History   Tobacco Use  . Smoking status: Former Research scientist (life sciences)  . Smokeless tobacco: Current User  Substance Use Topics  . Alcohol use: Yes    Comment: rearly  . Drug use: No    Review of Systems Constitutional: Negative for fever. Cardiovascular: Positive for chest pain Respiratory: Negative for shortness of breath. Gastrointestinal: Negative for abdominal pain, vomiting and diarrhea. Musculoskeletal: Negative for back pain. Skin: Negative for  rash. Neurological: Negative for headaches, focal weakness or numbness.  All systems negative/normal/unremarkable except as stated in the HPI  ____________________________________________   PHYSICAL EXAM:  VITAL SIGNS: ED Triage Vitals  Enc Vitals Group     BP 01/17/20 2114 (!) 134/95     Pulse Rate 01/17/20 2114 (!) 102     Resp 01/17/20 2114 20     Temp 01/17/20 2114 98.4 F (36.9 C)     Temp Source 01/17/20 2114 Oral     SpO2 01/17/20 2114 98 %     Weight 01/17/20 2112 230 lb (104.3 kg)     Height 01/17/20 2112 5\' 2"  (1.575 m)     Head Circumference --      Peak Flow --      Pain Score 01/17/20 2112 5     Pain Loc --      Pain Edu? --      Excl. in [place]? --     Constitutional: Alert and oriented. Well appearing and in no distress. Eyes: Conjunctivae are normal. Normal extraocular movements. Cardiovascular: Normal rate, regular rhythm. No murmurs, rubs, or gallops. Respiratory: Normal respiratory effort without tachypnea nor retractions. Breath sounds are clear and equal bilaterally. No wheezes/rales/rhonchi. Gastrointestinal: Soft and nontender. Normal bowel sounds Musculoskeletal: Nontender with normal range of motion in extremities. No lower extremity tenderness nor edema. Neurologic:  Normal speech and language. No gross focal neurologic deficits are appreciated.  Skin:  Skin is warm, dry and intact. No rash noted. Psychiatric: Mood and affect are normal. Speech  and behavior are normal.  ____________________________________________  EKG: Interpreted by me.  Sinus rhythm with rate of 95 bpm, normal PR interval, leftward axis, normal QT  ____________________________________________  ED COURSE:  As part of my medical decision making, I reviewed the following data within the electronic MEDICAL RECORD NUMBER History obtained from family if available, nursing notes, old chart and ekg, as well as notes from prior ED visits. Patient presented for chest pain, we will assess with  labs and imaging as indicated at this time.   Procedures  Sara Fitzpatrick was evaluated in Emergency Department on 01/17/2020 for the symptoms described in the history of present illness. She was evaluated in the context of the global COVID-19 pandemic, which necessitated consideration that the patient might be at risk for infection with the SARS-CoV-2 virus that causes COVID-19. Institutional protocols and algorithms that pertain to the evaluation of patients at risk for COVID-19 are in a state of rapid change based on information released by regulatory bodies including the CDC and federal and state organizations. These policies and algorithms were followed during the patient's care in the ED.  ____________________________________________   LABS (pertinent positives/negatives)  Labs Reviewed  COMPREHENSIVE METABOLIC PANEL - Abnormal; Notable for the following components:      Result Value   Glucose, Bld 106 (*)    Creatinine, Ser 1.17 (*)    GFR calc non Af Amer 55 (*)    All other components within normal limits  CBC  TROPONIN I (HIGH SENSITIVITY)  TROPONIN I (HIGH SENSITIVITY)    RADIOLOGY  Chest x-ray does not reveal any acute process  ____________________________________________   DIFFERENTIAL DIAGNOSIS   Musculoskeletal pain, GERD, anxiety, unstable angina, MI  FINAL ASSESSMENT AND PLAN  Chest pain   Plan: The patient had presented for nonspecific chest pain. Patient's labs were reassuring. Patient's imaging did not reveal any acute process.  Repeat troponin was negative.  This is possibly anxiety related.  She will be referred to cardiology for close outpatient follow-up.   Ulice Dash, MD    Note: This note was generated in part or whole with voice recognition software. Voice recognition is usually quite accurate but there are transcription errors that can and very often do occur. I apologize for any typographical errors that were not detected and  corrected.     Emily Filbert, MD 01/17/20 [phone]

## 2020-01-17 NOTE — Telephone Encounter (Signed)
Patient last seen 10/23/2019 by PA Krista Blue.  Need more recent BP checks/log since restarted on amlodipine 5mg  po daily.  Labs 03/28/2019  Needs fasting exec panel plus vitamin D female  Last physical in paper chart 01/05/2018 with Dr 01/07/2018 BP 120/70 weight 203 lbs previous 2 physicals 186 and 168 lbs.  Was on phentermine. Hx of cerebellar infarct refused aspirin and LDL goal less than 100 anxiety buspirone BID and night sweats black cohosh helping.  Cardiology 04/2018.  Stress test done.  Patient requested refill buspirone 10mg  po TID and amlodipine 5mg  po daily to [facility] location.  30 day bridge refill entered for patient but needs follow up with provider due to leg swelling/re-evaluation.

## 2020-01-17 NOTE — Telephone Encounter (Signed)
Sara Fitzpatrick said she hasn't checked it often , but when sh normally is 120-30/80.  Feels the swelling coming from the medicine.  Said the compression socks work good for the swelling.  She scheduled an appointment to come into the office tomorrow at 2:15 pm.  AMD

## 2020-01-18 ENCOUNTER — Other Ambulatory Visit: Payer: Self-pay | Admitting: Physician Assistant

## 2020-01-18 ENCOUNTER — Encounter: Payer: Self-pay | Admitting: Physician Assistant

## 2020-01-18 ENCOUNTER — Ambulatory Visit: Payer: Self-pay | Admitting: Physician Assistant

## 2020-01-18 DIAGNOSIS — R600 Localized edema: Secondary | ICD-10-CM

## 2020-01-18 DIAGNOSIS — I10 Essential (primary) hypertension: Secondary | ICD-10-CM

## 2020-01-18 HISTORY — DX: Localized edema: R60.0

## 2020-01-18 NOTE — Progress Notes (Signed)
   Subjective: Leg edema    Patient ID: RICARDO KAYES, female    DOB: [birth year], 49 y.o.   MRN: [id number]  HPI Patient presents for evaluation of lower extremity edema.  Patient has noticed increase of peripheral edema to lower extremities status post starting Norvasc last year.  Patient states he is now wearing support stockings which she purchased over-the-counter for the past 3 months.  Patient that her job requires prolonged standing.  Patient recently seen in emergency room for chest tightness/pain.  Patient had a cardiac work-up was grossly unremarkable.  Patient has been advised to follow-up with the cardiologist but has not call for an appointment.  Patient denies chest pain at this time.  Patient believes the episode was secondary to increased anxiety.   Review of Systems Anxiety/depression.  Hypertension.    Objective:   Physical Exam Patient appears no acute distress.  HEENT is grossly unremarkable.  Neck is supple for adenopathy or bruits.  Lungs are clear to auscultation his heart is regular rate and rhythm.  Physical exam shows very mild peripheral edema.  Patient states she has not been on her feet much today.  Patient was seen in emergency room yesterday and was in bed until approximately 2 hours before her appointment.       Assessment & Plan: Peripheral edema.  Discussed rationale for not discontinuing Norvasc until evaluation by cardiologist.  Patient will call tomorrow morning to schedule appointment.  Patient advised continue with support stockings until evaluation by cardiologist.  Patient prescription refill for her anxiety medication.

## 2020-02-02 NOTE — Telephone Encounter (Signed)
Cardiology appt pending 02/12/2020 per Epic

## 2020-02-12 DIAGNOSIS — I10 Essential (primary) hypertension: Secondary | ICD-10-CM | POA: Insufficient documentation

## 2020-02-13 ENCOUNTER — Other Ambulatory Visit: Payer: Self-pay | Admitting: Registered Nurse

## 2020-02-13 NOTE — Telephone Encounter (Signed)
[facility] pt

## 2020-02-14 ENCOUNTER — Other Ambulatory Visit: Payer: Self-pay | Admitting: Registered Nurse

## 2020-02-14 DIAGNOSIS — I10 Essential (primary) hypertension: Secondary | ICD-10-CM

## 2020-02-16 NOTE — Progress Notes (Signed)
Scheduled to complete physical 02/29/20.  (Provider TBD)  AMD 

## 2020-02-19 ENCOUNTER — Ambulatory Visit: Payer: 59

## 2020-02-19 ENCOUNTER — Other Ambulatory Visit: Payer: Self-pay

## 2020-02-19 DIAGNOSIS — Z0000 Encounter for general adult medical examination without abnormal findings: Secondary | ICD-10-CM

## 2020-02-19 LAB — POCT URINALYSIS DIPSTICK
Bilirubin, UA: NEGATIVE
Blood, UA: NEGATIVE
Glucose, UA: NEGATIVE
Ketones, UA: NEGATIVE
Leukocytes, UA: NEGATIVE
Nitrite, UA: NEGATIVE
Protein, UA: NEGATIVE
Spec Grav, UA: 1.02 (ref 1.010–1.025)
Urobilinogen, UA: 0.2 E.U./dL
pH, UA: 6 (ref 5.0–8.0)

## 2020-02-20 LAB — CMP12+LP+TP+TSH+6AC+CBC/D/PLT
ALT: 15 IU/L (ref 0–32)
AST: 20 IU/L (ref 0–40)
Albumin/Globulin Ratio: 1.9 (ref 1.2–2.2)
Albumin: 4.2 g/dL (ref 3.8–4.8)
Alkaline Phosphatase: 74 IU/L (ref 39–117)
BUN/Creatinine Ratio: 13 (ref 9–23)
BUN: 12 mg/dL (ref 6–24)
Basophils Absolute: 0 10*3/uL (ref 0.0–0.2)
Basos: 0 %
Bilirubin Total: 0.5 mg/dL (ref 0.0–1.2)
Calcium: 9 mg/dL (ref 8.7–10.2)
Chloride: 101 mmol/L (ref 96–106)
Chol/HDL Ratio: 3.1 ratio (ref 0.0–4.4)
Cholesterol, Total: 182 mg/dL (ref 100–199)
Creatinine, Ser: 0.91 mg/dL (ref 0.57–1.00)
EOS (ABSOLUTE): 0.1 10*3/uL (ref 0.0–0.4)
Eos: 2 %
Estimated CHD Risk: <0.5 times avg. (ref 0.0–1.0)
Free Thyroxine Index: 2 (ref 1.2–4.9)
GFR calc Af Amer: 87 mL/min/{1.73_m2} (ref >59)
GFR calc non Af Amer: 75 mL/min/{1.73_m2} (ref >59)
GGT: 13 IU/L (ref 0–60)
Globulin, Total: 2.2 g/dL (ref 1.5–4.5)
Glucose: 98 mg/dL (ref 65–99)
HDL: 58 mg/dL (ref >39)
Hematocrit: 39.7 % (ref 34.0–46.6)
Hemoglobin: 13.8 g/dL (ref 11.1–15.9)
Immature Grans (Abs): 0 10*3/uL (ref 0.0–0.1)
Immature Granulocytes: 0 %
Iron: 102 ug/dL (ref 27–159)
LDH: 226 IU/L (ref 119–226)
LDL Chol Calc (NIH): 110 mg/dL — ABNORMAL HIGH (ref 0–99)
Lymphocytes Absolute: 1.7 10*3/uL (ref 0.7–3.1)
Lymphs: 35 %
MCH: 31.2 pg (ref 26.6–33.0)
MCHC: 34.8 g/dL (ref 31.5–35.7)
MCV: 90 fL (ref 79–97)
Monocytes Absolute: 0.4 10*3/uL (ref 0.1–0.9)
Monocytes: 7 %
Neutrophils Absolute: 2.7 10*3/uL (ref 1.4–7.0)
Neutrophils: 56 %
Phosphorus: 2.8 mg/dL — ABNORMAL LOW (ref 3.0–4.3)
Platelets: 259 10*3/uL (ref 150–450)
Potassium: 3.6 mmol/L (ref 3.5–5.2)
RBC: 4.43 x10E6/uL (ref 3.77–5.28)
RDW: 12.5 % (ref 11.7–15.4)
Sodium: 141 mmol/L (ref 134–144)
T3 Uptake Ratio: 27 % (ref 24–39)
T4, Total: 7.5 ug/dL (ref 4.5–12.0)
TSH: 0.908 u[IU]/mL (ref 0.450–4.500)
Total Protein: 6.4 g/dL (ref 6.0–8.5)
Triglycerides: 75 mg/dL (ref 0–149)
Uric Acid: 5.7 mg/dL (ref 2.6–6.2)
VLDL Cholesterol Cal: 14 mg/dL (ref 5–40)
WBC: 4.9 10*3/uL (ref 3.4–10.8)

## 2020-03-08 NOTE — Telephone Encounter (Signed)
Cardiology appt now scheduled for 03/11/2020 and [facility] provider follow up appt also scheduled per Epic

## 2020-03-13 ENCOUNTER — Ambulatory Visit: Payer: Self-pay | Admitting: Emergency Medicine

## 2020-03-13 ENCOUNTER — Telehealth: Payer: Self-pay | Admitting: Emergency Medicine

## 2020-03-13 ENCOUNTER — Other Ambulatory Visit: Payer: Self-pay

## 2020-03-13 ENCOUNTER — Encounter: Payer: Self-pay | Admitting: Emergency Medicine

## 2020-03-13 VITALS — BP 142/78 | HR 93 | Temp 96.7°F | Resp 16 | Ht 62.0 in | Wt 232.0 lb

## 2020-03-13 DIAGNOSIS — Z0000 Encounter for general adult medical examination without abnormal findings: Secondary | ICD-10-CM

## 2020-03-13 DIAGNOSIS — F411 Generalized anxiety disorder: Secondary | ICD-10-CM

## 2020-03-13 MED ORDER — BUSPIRONE HCL 10 MG PO TABS: 10.0000 mg | ORAL_TABLET | Freq: Three times a day (TID) | ORAL | 5 refills | Status: DC

## 2020-03-13 NOTE — Progress Notes (Signed)
[facility] Provider Note       Time seen: 10:01 AM    I have reviewed the vital signs and the nursing notes.  HISTORY   Chief Complaint Employment Physical (pt wants to talk about her weight)    HPI Sara Fitzpatrick is a 49 y.o. female with a history of endometriosis, allergy, migraines, obesity, CVA who presents today for annual physical examination and routine health maintenance.  Patient is discouraged with lack of weight loss.  He is almost 1 pounds.  Describes changes in diet, exercise and fluid intake.  He denies any recent illness or other complaints.  Had recently been seen by me in the ER and has not had further chest pain.  Outpatient echocardiogram was normal.  Past Medical History:  Diagnosis Date  . Blood clot in vein    right arm  . Endometriosis   . Fibromyalgia   . Migraine   . Mild peripheral edema 01/18/2020  . Pelvic pain   . Stroke (cerebrum) [facility])     Past Surgical History:  Procedure Laterality Date  . ABDOMINAL HYSTERECTOMY     lsh  . CESAREAN SECTION    . LAPAROSCOPY     x 3    Allergies Codeine, Other, Hydrocodone-acetaminophen, and Morphine and related   Review of Systems Constitutional: Negative for fever. Cardiovascular: Negative for chest pain. Respiratory: Negative for shortness of breath. Gastrointestinal: Negative for abdominal pain, vomiting and diarrhea. Musculoskeletal: Negative for back pain. Skin: Negative for rash. Neurological: Negative for headaches, focal weakness or numbness.  All systems negative/normal/unremarkable except as stated in the HPI  ____________________________________________   PHYSICAL EXAM:  VITAL SIGNS: Vitals:   03/13/20 0952  BP: (!) 142/78  Pulse: 93  Resp: 16  Temp: (!) 96.7 F (35.9 C)  SpO2: 99%    Constitutional: Alert and oriented. Well appearing and in no distress. Eyes: Conjunctivae are normal. Normal extraocular movements. ENT      Head:  Normocephalic and atraumatic.      Nose: No congestion/rhinnorhea.      Mouth/Throat: Mucous membranes are moist.      Neck: No stridor. Cardiovascular: Normal rate, regular rhythm. No murmurs, rubs, or gallops. Respiratory: Normal respiratory effort without tachypnea nor retractions. Breath sounds are clear and equal bilaterally. No wheezes/rales/rhonchi. Gastrointestinal: Soft and nontender. Normal bowel sounds Musculoskeletal: Nontender with normal range of motion in extremities. No lower extremity tenderness nor edema. Neurologic:  Normal speech and language. No gross focal neurologic deficits are appreciated.  Skin:  Skin is warm, dry and intact. No rash noted. Psychiatric: Speech and behavior are normal.    ____________________________________________   LABS (pertinent positives/negatives)  Recent Results (from the past 2160 hour(s))  CBC     Status: None   Collection Time: 01/17/20  9:15 PM  Result Value Ref Range   WBC 7.2 4.0 - 10.5 K/uL   RBC 4.54 3.87 - 5.11 MIL/uL   Hemoglobin 13.9 12.0 - 15.0 g/dL   HCT 41.7 36.0 - 46.0 %   MCV 91.9 80.0 - 100.0 fL   MCH 30.6 26.0 - 34.0 pg   MCHC 33.3 30.0 - 36.0 g/dL   RDW 13.3 11.5 - 15.5 %   Platelets 276 150 - 400 K/uL   nRBC 0.0 0.0 - 0.2 %    Comment: Performed at [facility], [address], [place], [place] [address]  Comprehensive metabolic panel     Status: Abnormal   Collection Time: 01/17/20  9:15 PM  Result Value Ref Range   Sodium 140 135 - 145 mmol/L   Potassium 3.5 3.5 - 5.1 mmol/L   Chloride 105 98 - 111 mmol/L   CO2 25 22 - 32 mmol/L   Glucose, Bld 106 (H) 70 - 99 mg/dL    Comment: Glucose reference range applies only to samples taken after fasting for at least 8 hours.   BUN 13 6 - 20 mg/dL   Creatinine, Ser 1.17 (H) 0.44 - 1.00 mg/dL   Calcium 8.9 8.9 - 10.3 mg/dL   Total Protein 7.5 6.5 - 8.1 g/dL   Albumin 4.2 3.5 - 5.0 g/dL   AST 22 15 - 41 U/L   ALT 19 0 - 44 U/L   Alkaline Phosphatase  64 38 - 126 U/L   Total Bilirubin 0.6 0.3 - 1.2 mg/dL   GFR calc non Af Amer 55 (L) >60 mL/min   GFR calc Af Amer >60 >60 mL/min   Anion gap 10 5 - 15    Comment: Performed at [facility], [place], [place] [address]  Troponin I (High Sensitivity)     Status: None   Collection Time: 01/17/20  9:15 PM  Result Value Ref Range   Troponin I (High Sensitivity) 5 <18 ng/L    Comment: (NOTE) Elevated high sensitivity troponin I (hsTnI) values and significant  changes across serial measurements may suggest ACS but many other  chronic and acute conditions are known to elevate hsTnI results.  Refer to the "Links" section for chest pain algorithms and additional  guidance. Performed at [facility], [place], [place] [address]   Troponin I (High Sensitivity)     Status: None   Collection Time: 01/17/20 10:45 PM  Result Value Ref Range   Troponin I (High Sensitivity) 4 <18 ng/L    Comment: (NOTE) Elevated high sensitivity troponin I (hsTnI) values and significant  changes across serial measurements may suggest ACS but many other  chronic and acute conditions are known to elevate hsTnI results.  Refer to the "Links" section for chest pain algorithms and additional  guidance. Performed at [facility], [address] [place], [place] [address]   CMP12+LP+TP+TSH+6AC+CBC/D/Plt     Status: Abnormal   Collection Time: 02/19/20  9:01 AM  Result Value Ref Range   Glucose 98 65 - 99 mg/dL   Uric Acid 5.7 2.6 - 6.2 mg/dL    Comment:            Therapeutic target for gout patients: <6.0   BUN 12 6 - 24 mg/dL   Creatinine, Ser 0.91 0.57 - 1.00 mg/dL   GFR calc non Af Amer 75 >59 mL/min/1.73   GFR calc Af Amer 87 >59 mL/min/1.73    Comment: **[facility] currently reports eGFR in compliance with the current**   recommendations of the [employer]. [facility] will   update reporting as new guidelines are published from the  NKF-ASN   Task force.    BUN/Creatinine Ratio 13 9 - 23   Sodium 141 134 - 144 mmol/L   Potassium 3.6 3.5 - 5.2 mmol/L   Chloride 101 96 - 106 mmol/L   Calcium 9.0 8.7 - 10.2 mg/dL   Phosphorus 2.8 (L) 3.0 - 4.3 mg/dL   Total Protein 6.4 6.0 - 8.5 g/dL   Albumin 4.2 3.8 - 4.8 g/dL   Globulin, Total 2.2 1.5 - 4.5 g/dL   Albumin/Globulin Ratio 1.9 1.2 - 2.2  Bilirubin Total 0.5 0.0 - 1.2 mg/dL   Alkaline Phosphatase 74 39 - 117 IU/L   LDH 226 119 - 226 IU/L   AST 20 0 - 40 IU/L   ALT 15 0 - 32 IU/L   GGT 13 0 - 60 IU/L   Iron 102 27 - 159 ug/dL   Cholesterol, Total 182 100 - 199 mg/dL   Triglycerides 75 0 - 149 mg/dL   HDL 58 >39 mg/dL   VLDL Cholesterol Cal 14 5 - 40 mg/dL   LDL Chol Calc (NIH) 110 (H) 0 - 99 mg/dL   Chol/HDL Ratio 3.1 0.0 - 4.4 ratio    Comment:                                   T. Chol/HDL Ratio                                             Men  Women                               1/2 Avg.Risk  3.4    3.3                                   Avg.Risk  5.0    4.4                                2X Avg.Risk  9.6    7.1                                3X Avg.Risk 23.4   11.0    Estimated CHD Risk  < 0.5 0.0 - 1.0 times avg.    Comment: The CHD Risk is based on the T. Chol/HDL ratio. Other factors affect CHD Risk such as hypertension, smoking, diabetes, severe obesity, and family history of premature CHD.    TSH 0.908 0.450 - 4.500 uIU/mL   T4, Total 7.5 4.5 - 12.0 ug/dL   T3 Uptake Ratio 27 24 - 39 %   Free Thyroxine Index 2.0 1.2 - 4.9   WBC 4.9 3.4 - 10.8 x10E3/uL   RBC 4.43 3.77 - 5.28 x10E6/uL   Hemoglobin 13.8 11.1 - 15.9 g/dL   Hematocrit 39.7 34.0 - 46.6 %   MCV 90 79 - 97 fL   MCH 31.2 26.6 - 33.0 pg   MCHC 34.8 31.5 - 35.7 g/dL   RDW 12.5 11.7 - 15.4 %   Platelets 259 150 - 450 x10E3/uL   Neutrophils 56 Not Estab. %   Lymphs 35 Not Estab. %   Monocytes 7 Not Estab. %   Eos 2 Not Estab. %   Basos 0 Not Estab. %   Neutrophils Absolute 2.7 1.4 -  7.0 x10E3/uL   Lymphocytes Absolute 1.7 0.7 - 3.1 x10E3/uL   Monocytes Absolute 0.4 0.1 - 0.9 x10E3/uL   EOS (ABSOLUTE) 0.1 0.0 - 0.4 x10E3/uL   Basophils Absolute 0.0 0.0 - 0.2 x10E3/uL   Immature Granulocytes 0 Not Estab. %   Immature  Grans (Abs) 0.0 0.0 - 0.1 x10E3/uL  POCT urinalysis dipstick     Status: None   Collection Time: 02/19/20  9:41 AM  Result Value Ref Range   Color, UA Yellow    Clarity, UA Clear    Glucose, UA Negative Negative   Bilirubin, UA Negative    Ketones, UA Negative    Spec Grav, UA 1.020 1.010 - 1.025   Blood, UA Negative    pH, UA 6.0 5.0 - 8.0   Protein, UA Negative Negative   Urobilinogen, UA 0.2 0.2 or 1.0 E.U./dL   Nitrite, UA Negative    Leukocytes, UA Negative Negative   Appearance     Odor       DIFFERENTIAL DIAGNOSIS  Annual physical examination, routine health maintenance  ASSESSMENT AND PLAN  Annual physical examination   Plan: The patient had presented for annual physical.  Patient's labs were overall reassuring.  We are working with her to encourage continue weight loss.  It is encouraging that she is involved in any cities health and wellness program.  She is cleared for outpatient follow-up.  Lenise Arena MD    Note: This note was generated in part or whole with voice recognition software. Voice recognition is usually quite accurate but there are transcription errors that can and very often do occur. I apologize for any typographical errors that were not detected and corrected.

## 2020-03-13 NOTE — Telephone Encounter (Signed)
Refilled Buspar.

## 2020-03-19 DIAGNOSIS — E66813 Obesity, class 3: Secondary | ICD-10-CM | POA: Insufficient documentation

## 2020-03-19 DIAGNOSIS — R600 Localized edema: Secondary | ICD-10-CM | POA: Diagnosis not present

## 2020-03-19 DIAGNOSIS — I10 Essential (primary) hypertension: Secondary | ICD-10-CM | POA: Diagnosis not present

## 2020-03-19 DIAGNOSIS — R079 Chest pain, unspecified: Secondary | ICD-10-CM | POA: Diagnosis not present

## 2020-03-19 DIAGNOSIS — Z6841 Body Mass Index (BMI) 40.0 and over, adult: Secondary | ICD-10-CM | POA: Insufficient documentation

## 2020-04-09 ENCOUNTER — Encounter: Payer: Self-pay | Admitting: Emergency Medicine

## 2020-04-09 ENCOUNTER — Emergency Department: Payer: 59

## 2020-04-09 ENCOUNTER — Other Ambulatory Visit: Payer: Self-pay

## 2020-04-09 ENCOUNTER — Emergency Department
Admission: EM | Admit: 2020-04-09 | Discharge: 2020-04-09 | Disposition: A | Discharge status: Home / Self Care | Payer: 59 | Attending: Emergency Medicine | Admitting: Emergency Medicine

## 2020-04-09 DIAGNOSIS — I10 Essential (primary) hypertension: Secondary | ICD-10-CM | POA: Diagnosis not present

## 2020-04-09 DIAGNOSIS — R42 Dizziness and giddiness: Secondary | ICD-10-CM | POA: Diagnosis not present

## 2020-04-09 DIAGNOSIS — Z7982 Long term (current) use of aspirin: Secondary | ICD-10-CM | POA: Insufficient documentation

## 2020-04-09 DIAGNOSIS — R531 Weakness: Secondary | ICD-10-CM

## 2020-04-09 DIAGNOSIS — R0602 Shortness of breath: Secondary | ICD-10-CM | POA: Insufficient documentation

## 2020-04-09 DIAGNOSIS — R0789 Other chest pain: Secondary | ICD-10-CM | POA: Diagnosis not present

## 2020-04-09 DIAGNOSIS — R079 Chest pain, unspecified: Secondary | ICD-10-CM

## 2020-04-09 DIAGNOSIS — Z79899 Other long term (current) drug therapy: Secondary | ICD-10-CM | POA: Insufficient documentation

## 2020-04-09 DIAGNOSIS — R569 Unspecified convulsions: Secondary | ICD-10-CM | POA: Diagnosis not present

## 2020-04-09 DIAGNOSIS — R270 Ataxia, unspecified: Secondary | ICD-10-CM | POA: Diagnosis not present

## 2020-04-09 HISTORY — DX: Essential (primary) hypertension: I10

## 2020-04-09 LAB — HEPATIC FUNCTION PANEL
ALT: 19 U/L (ref 0–44)
AST: 25 U/L (ref 15–41)
Albumin: 4.1 g/dL (ref 3.5–5.0)
Alkaline Phosphatase: 66 U/L (ref 38–126)
Bilirubin, Direct: 0.2 mg/dL (ref 0.0–0.2)
Indirect Bilirubin: 0.7 mg/dL (ref 0.3–0.9)
Total Bilirubin: 0.9 mg/dL (ref 0.3–1.2)
Total Protein: 7.3 g/dL (ref 6.5–8.1)

## 2020-04-09 LAB — CBC
HCT: 41 % (ref 36.0–46.0)
Hemoglobin: 14 g/dL (ref 12.0–15.0)
MCH: 30.8 pg (ref 26.0–34.0)
MCHC: 34.1 g/dL (ref 30.0–36.0)
MCV: 90.1 fL (ref 80.0–100.0)
Platelets: 190 10*3/uL (ref 150–400)
RBC: 4.55 MIL/uL (ref 3.87–5.11)
RDW: 13.6 % (ref 11.5–15.5)
WBC: 6.7 10*3/uL (ref 4.0–10.5)
nRBC: 0 % (ref 0.0–0.2)

## 2020-04-09 LAB — URINALYSIS, COMPLETE (UACMP) WITH MICROSCOPIC
Bilirubin Urine: NEGATIVE
Glucose, UA: NEGATIVE mg/dL
Hgb urine dipstick: NEGATIVE
Ketones, ur: NEGATIVE mg/dL
Leukocytes,Ua: NEGATIVE
Nitrite: NEGATIVE
Protein, ur: NEGATIVE mg/dL
Specific Gravity, Urine: 1.01 (ref 1.005–1.030)
pH: 6 (ref 5.0–8.0)

## 2020-04-09 LAB — BASIC METABOLIC PANEL
Anion gap: 10 (ref 5–15)
BUN: 10 mg/dL (ref 6–20)
CO2: 27 mmol/L (ref 22–32)
Calcium: 9.1 mg/dL (ref 8.9–10.3)
Chloride: 102 mmol/L (ref 98–111)
Creatinine, Ser: 0.97 mg/dL (ref 0.44–1.00)
GFR calc Af Amer: >60 mL/min (ref >60)
GFR calc non Af Amer: >60 mL/min (ref >60)
Glucose, Bld: 104 mg/dL — ABNORMAL HIGH (ref 70–99)
Potassium: 3.8 mmol/L (ref 3.5–5.1)
Sodium: 139 mmol/L (ref 135–145)

## 2020-04-09 LAB — TROPONIN I (HIGH SENSITIVITY)
Troponin I (High Sensitivity): 3 ng/L (ref <18)
Troponin I (High Sensitivity): 3 ng/L (ref <18)

## 2020-04-09 LAB — FIBRIN DERIVATIVES D-DIMER (ARMC ONLY): Fibrin derivatives D-dimer (ARMC): 356.13 ng/mL (FEU) (ref 0.00–499.00)

## 2020-04-09 LAB — LIPASE, BLOOD: Lipase: 24 U/L (ref 11–51)

## 2020-04-09 MED ORDER — ACETAMINOPHEN 500 MG PO TABS
1000.0000 mg | ORAL_TABLET | Freq: Once | ORAL | Status: AC
  Administered 2020-04-09: 1000 mg via ORAL
  Filled 2020-04-09: qty 2

## 2020-04-09 MED ORDER — KETOROLAC TROMETHAMINE 30 MG/ML IJ SOLN: 30.0000 mg | Freq: Once | INTRAMUSCULAR | Status: DC

## 2020-04-09 NOTE — ED Notes (Signed)
EDP at bedside at this time.  

## 2020-04-09 NOTE — Discharge Instructions (Addendum)
Your MRI is as below.  Should follow this up with your neurologist.  Your heart markers were reassuring no evidence of pulmonary embolism.  You should return to the ER if you develop worsening symptoms or any other concerns   IMPRESSION:  1. No acute intracranial abnormality identified.  2. Small remote infarct in the right cerebellar hemisphere.  3. Scattered foci of T2 hyperintensity within the white matter of  the cerebral hemispheres, predominantly in the bilateral frontal  lobes. Findings are nonspecific and may be seen in the setting of  chronic small vessel ischemic disease, migraine headaches or  sequelae of prior infectious or inflammatory process.

## 2020-04-09 NOTE — ED Notes (Signed)
Pt transported to MRI at this time 

## 2020-04-09 NOTE — ED Provider Notes (Signed)
[facility] Emergency Department Provider Note  ____________________________________________   First MD Initiated Contact with Patient 04/09/20 1102     (approximate)  I have reviewed the triage vital signs and the nursing notes.   HISTORY  Chief Complaint Chest Pain and Weakness    HPI Sara Fitzpatrick is a 49 y.o. female with fibromyalgia, hypertension, migraine, stroke who comes in with chest pain, dizziness and shortness of breath that started last night.  Patient did take her anxiety meds without any relief in symptoms.  Patient states that her symptoms feel like a chest pressure sensation and some stabbing chest pain and feels like she cannot catch her breath with a little bit of shortness of breath.  Her symptoms have been constant since yesterday, not better with anxiety meds, nothing makes it worse.  States that she had an echocardiogram previously that was reassuring.  Patient also reports worsening right-sided weakness over the past few days and may be some worsening tingling sensation.  No aphasia.  Patient states that she has some prior right-sided weakness.  She does report having a headache yesterday as well.  No vision changes with it.  States that the headaches have  resolved today.          Past Medical History:  Diagnosis Date  . Blood clot in vein    right arm  . Endometriosis   . Fibromyalgia   . Hypertension   . Migraine   . Mild peripheral edema 01/18/2020  . Pelvic pain   . Stroke (cerebrum) [facility])     Patient Active Problem List   Diagnosis Date Noted  . Hypertension, essential 02/12/2020  . Mild peripheral edema 01/18/2020  . Chest pain at rest 04/24/2018  . Ischemic finger 06/03/2017  . Embolism and thrombosis of arteries of upper extremities ([place]) 06/03/2017  . Pain in finger of right hand 06/03/2017  . Raynaud phenomenon 06/03/2017  . History of cesarean section 09/01/2016  . Status post laparoscopic supracervical  hysterectomy 09/01/2016  . Pelvic pain 09/01/2016  . Endometriosis 09/01/2016    Past Surgical History:  Procedure Laterality Date  . ABDOMINAL HYSTERECTOMY     lsh  . CESAREAN SECTION    . LAPAROSCOPY     x 3    Prior to Admission medications   Medication Sig Start Date End Date Taking? Authorizing Provider  albuterol (VENTOLIN HFA) 108 (90 Base) MCG/ACT inhaler Inhale 2 puffs into the lungs every 6 (six) hours as needed. 11/13/19   [provider]  amLODipine (NORVASC) 5 MG tablet TAKE 1 TABLET BY MOUTH EVERY DAY Patient not taking: Reported on 03/13/2020 02/15/20   Sable Feil, PA-C  aspirin EC 81 MG tablet Take 81 mg by mouth daily.    [provider]  Aspirin-Salicylamide-Caffeine (BC HEADACHE POWDER PO) Take 1 Package by mouth as needed (migraines).    [provider]  benzonatate (TESSALON) 200 MG capsule Take 200 mg by mouth 3 (three) times daily as needed. 11/13/19   [provider]  busPIRone (BUSPAR) 10 MG tablet TAKE 1 TABLET BY MOUTH THREE TIMES A DAY 02/13/20   Sable Feil, PA-C  busPIRone (BUSPAR) 10 MG tablet Take 1 tablet (10 mg total) by mouth 3 (three) times daily. 03/13/20   Earleen Newport, MD  Calcium 600-200 MG-UNIT tablet Take 1 tablet by mouth daily.    [provider]  hydrochlorothiazide (HYDRODIURIL) 25 MG tablet Take 25 mg by mouth daily. 02/12/20  [provider]  LORazepam (ATIVAN) 0.5 MG tablet Take 1 tablet (0.5 mg total) by mouth every 8 (eight) hours as needed for anxiety. Patient not taking: Reported on 03/13/2020 01/17/20 01/16/21  Emily Filbert, MD  Vitamin D, Ergocalciferol, (DRISDOL) 1.25 MG (50000 UT) CAPS capsule Take 1 capsule (50,000 Units total) by mouth every 7 (seven) days. 04/03/19   Hildred Laser, MD    Allergies Codeine, Other, Hydrocodone-acetaminophen, and Morphine and related  Family History  Problem Relation Age of Onset  . Diabetes Mother   . Heart disease Mother    . Heart disease Father   . Leukemia Maternal Grandmother   . Breast cancer Maternal Grandmother        60-70  . Heart disease Maternal Grandmother   . Colon cancer Maternal Grandfather   . Heart failure Maternal Grandfather   . Heart disease Maternal Grandfather   . Heart failure Paternal Grandmother   . Heart disease Paternal Grandmother   . Heart disease Paternal Grandfather   . Ovarian cancer Neg Hx     Social History Social History   Tobacco Use  . Smoking status: Former Games developer  . Smokeless tobacco: Current User  Vaping Use  . Vaping Use: Every day  Substance Use Topics  . Alcohol use: Yes    Comment: occasionally   . Drug use: Never      Review of Systems Constitutional: No fever/chills Eyes: No visual changes. ENT: No sore throat. Cardiovascular: Positive chest pain Respiratory: Positive shortness of breath Gastrointestinal: No abdominal pain.  No nausea, no vomiting.  No diarrhea.  No constipation. Genitourinary: Negative for dysuria. Musculoskeletal: Negative for back pain. Skin: Negative for rash. Neurological: Positive headache, right-sided weakness and tingling  All other ROS negative ____________________________________________   PHYSICAL EXAM:  VITAL SIGNS: ED Triage Vitals  Enc Vitals Group     BP 04/09/20 1038 (!) 142/91     Pulse Rate 04/09/20 1038 80     Resp 04/09/20 1038 16     Temp 04/09/20 1038 97.6 F (36.4 C)     Temp Source 04/09/20 1038 Oral     SpO2 04/09/20 1038 99 %     Weight 04/09/20 1039 230 lb (104.3 kg)     Height 04/09/20 1039 5\' 2"  (1.575 m)     Head Circumference --      Peak Flow --      Pain Score 04/09/20 1038 8     Pain Loc --      Pain Edu? --      Excl. in GC? --     Constitutional: Alert and oriented. Well appearing and in no acute distress. Eyes: Conjunctivae are normal. EOMI. Head: Atraumatic. Nose: No congestion/rhinnorhea. Mouth/Throat: Mucous membranes are moist.   Neck: No stridor. Trachea  Midline. FROM Cardiovascular: Normal rate, regular rhythm. Grossly normal heart sounds.  Good peripheral circulation. Respiratory: Normal respiratory effort.  No retractions. Lungs CTAB. Gastrointestinal: Soft and nontender. No distention. No abdominal bruits.  Musculoskeletal: No lower extremity tenderness nor edema.  No joint effusions. Neurologic:  Normal speech and language..  There is some right sided weakness and some sensation changes in both the arm and leg.  Cranial nerves otherwise appear intact Skin:  Skin is warm, dry Very mild right-sided weaknessand intact. No rash noted. Psychiatric: Mood and affect are normal. Speech and behavior are normal. GU: Deferred   ____________________________________________   LABS (all labs ordered are listed, but only abnormal results are displayed)  Labs Reviewed  BASIC METABOLIC PANEL - Abnormal; Notable for the following components:      Result Value   Glucose, Bld 104 (*)    All other components within normal limits  URINALYSIS, COMPLETE (UACMP) WITH MICROSCOPIC - Abnormal; Notable for the following components:   Color, Urine YELLOW (*)    APPearance CLEAR (*)    Bacteria, UA MANY (*)    All other components within normal limits  URINE CULTURE  CBC  HEPATIC FUNCTION PANEL  LIPASE, BLOOD  FIBRIN DERIVATIVES D-DIMER ([facility] ONLY)  TROPONIN I (HIGH SENSITIVITY)  TROPONIN I (HIGH SENSITIVITY)   ____________________________________________   ED ECG REPORT I, Concha Se, the attending physician, personally viewed and interpreted this ECG.  EKG is sinus rate of 85, no ST elevation, no T wave inversions, normal intervals ____________________________________________  RADIOLOGY Vela Prose, personally viewed and evaluated these images (plain radiographs) as part of my medical decision making, as well as reviewing the written report by the radiologist.  ED MD interpretation: No pneumonia  Official radiology report(s): DG Chest 2  View  Result Date: 04/09/2020 CLINICAL DATA:  Chest pain, dizziness, and shortness of breath. EXAM: CHEST - 2 VIEW COMPARISON:  01/17/2020 FINDINGS: The heart size and mediastinal contours are within normal limits. Both lungs are clear. The visualized skeletal structures are unremarkable. IMPRESSION: Normal exam. Electronically Signed   By: Francene Boyers M.D.   On: 04/09/2020 11:41   CT HEAD WO CONTRAST  Result Date: 04/09/2020 CLINICAL DATA:  Ataxia, suspected stroke EXAM: CT HEAD WITHOUT CONTRAST TECHNIQUE: Contiguous axial images were obtained from the base of the skull through the vertex without intravenous contrast. COMPARISON:  05/15/2012 FINDINGS: Brain: No evidence of acute infarction, hemorrhage, hydrocephalus, extra-axial collection or mass lesion/mass effect. Vascular: No hyperdense vessel or unexpected calcification. Skull: Normal. Negative for fracture or focal lesion. Sinuses/Orbits: Incompletely imaged, normal to the extent visualized. Other: None. IMPRESSION: No acute intracranial abnormality. Electronically Signed   By: Donzetta Kohut M.D.   On: 04/09/2020 11:44   MR BRAIN WO CONTRAST  Result Date: 04/09/2020 CLINICAL DATA:  Seizure.  Abnormal neuro exam. EXAM: MRI HEAD WITHOUT CONTRAST TECHNIQUE: Multiplanar, multiecho pulse sequences of the brain and surrounding structures were obtained without intravenous contrast. COMPARISON:  Head CT April 09, 2020 FINDINGS: Brain: No acute infarction, hemorrhage, hydrocephalus, extra-axial collection or mass lesion. Small remote infarct in the right cerebellar hemisphere. Scattered foci of T2 hyperintensity are seen within the white matter of the cerebral hemispheres, predominantly in the bilateral frontal lobes, nonspecific. Vascular: Normal flow voids. Skull and upper cervical spine: Normal marrow signal. Sinuses/Orbits: Negative. Other: None. IMPRESSION: 1. No acute intracranial abnormality identified. 2. Small remote infarct in the right  cerebellar hemisphere. 3. Scattered foci of T2 hyperintensity within the white matter of the cerebral hemispheres, predominantly in the bilateral frontal lobes. Findings are nonspecific and may be seen in the setting of chronic small vessel ischemic disease, migraine headaches or sequelae of prior infectious or inflammatory process. Electronically Signed   By: Baldemar Lenis M.D.   On: 04/09/2020 13:37    ____________________________________________   PROCEDURES  Procedure(s) performed (including Critical Care):  Procedures   ____________________________________________   INITIAL IMPRESSION / ASSESSMENT AND PLAN / ED COURSE  Sara Fitzpatrick was evaluated in Emergency Department on 04/09/2020 for the symptoms described in the history of present illness. She was evaluated in the context of the global COVID-19 pandemic, which necessitated consideration that the patient might be at risk  for infection with the SARS-CoV-2 virus that causes COVID-19. Institutional protocols and algorithms that pertain to the evaluation of patients at risk for COVID-19 are in a state of rapid change based on information released by regulatory bodies including the CDC and federal and state organizations. These policies and algorithms were followed during the patient's care in the ED.    Patient is a 49 year old who comes in with chest pain, shortness of breath as well as some right-sided intermittent weakness.  For patient's chest pain will get EKG and cardiac markers due to her age.  It does not sound like a dissection.  No pain radiating to her back.  Patient is low risk for PE but unable to [facility] out due to her history of DVT in the setting of estrogen use.  Will get a D-dimer to further evaluate.  Will get chest x-ray to evaluate for pneumonia.  Will get labs to evaluate for anemia.  For patient's right-sided weakness.  Patient states that she is slightly weaker on the right and with a history of  prior stroke.  Could be just recrudescence from the above but will get CT head to make sure no evidence of intra or cranial bleed.  Patient also does report a headache.  No blurry vision to suggest cavernous venous thrombosis.  Lab work is reassuring.  D-dimer is negative.  Cardiac markers are negative.  CT head was negative and chest x-ray was negative.  Discussed with getting an MRI to make sure no evidence of new acute stroke given she is report a little bit more weakness on the right side.  If MRI is negative patient can follow-up with her neurologist.  MRI as above.  Discussed with patient following up with the neurologist.  Patient states her pain is now resolved and feels comfortable going home at this time and following up with neurology.  ____________________________________________   FINAL CLINICAL IMPRESSION(S) / ED DIAGNOSES   Final diagnoses:  Chest pain, unspecified type  Weakness      MEDICATIONS GIVEN DURING THIS VISIT:  Medications  acetaminophen (TYLENOL) tablet 1,000 mg (1,000 mg Oral Given 04/09/20 1237)     ED Discharge Orders    None       Note:  This document was prepared using Dragon voice recognition software and may include unintentional dictation errors.   Concha Se, MD 04/09/20 1535

## 2020-04-09 NOTE — ED Triage Notes (Addendum)
Patient reports central chest pain, dizziness, and SOB that started last night. Denies history of similar pain. History of anxiety but states this feels different. Took anxiety meds at home with no relief.   Patient reports she has also increased weakness on right side and intermittent headaches for the past couple of weeks. Hx of CVA.

## 2020-04-10 LAB — URINE CULTURE

## 2020-04-11 NOTE — Telephone Encounter (Signed)
Per epic saw Dr Lady Gary cardiology 03/19/2020

## 2020-04-13 ENCOUNTER — Other Ambulatory Visit: Payer: Self-pay | Admitting: Physician Assistant

## 2020-05-07 ENCOUNTER — Ambulatory Visit
Admission: RE | Admit: 2020-05-07 | Discharge: 2020-05-07 | Disposition: A | Discharge status: Home / Self Care | Payer: 59 | Source: Ambulatory Visit | Attending: Physician Assistant | Admitting: Physician Assistant

## 2020-05-07 ENCOUNTER — Encounter: Payer: Self-pay | Admitting: Physician Assistant

## 2020-05-07 ENCOUNTER — Other Ambulatory Visit: Payer: Self-pay

## 2020-05-07 ENCOUNTER — Ambulatory Visit: Payer: Self-pay | Admitting: Physician Assistant

## 2020-05-07 VITALS — BP 132/79 | Temp 98.4°F | Resp 16 | Ht 62.0 in | Wt 233.0 lb

## 2020-05-07 DIAGNOSIS — R109 Unspecified abdominal pain: Secondary | ICD-10-CM

## 2020-05-07 DIAGNOSIS — R1012 Left upper quadrant pain: Secondary | ICD-10-CM

## 2020-05-07 LAB — POCT URINALYSIS DIPSTICK
Bilirubin, UA: NEGATIVE
Glucose, UA: NEGATIVE
Ketones, UA: NEGATIVE
Leukocytes, UA: NEGATIVE
Nitrite, UA: NEGATIVE
Protein, UA: NEGATIVE
Spec Grav, UA: 1.015 (ref 1.010–1.025)
Urobilinogen, UA: 0.2 E.U./dL
pH, UA: 7.5 (ref 5.0–8.0)

## 2020-05-07 MED ORDER — ONDANSETRON HCL 8 MG PO TABS
8.0000 mg | ORAL_TABLET | Freq: Three times a day (TID) | ORAL | 0 refills | Status: DC | PRN
Start: 2020-05-07 — End: 2021-03-04

## 2020-05-07 MED ORDER — KETOROLAC TROMETHAMINE 10 MG PO TABS: 10.0000 mg | ORAL_TABLET | Freq: Four times a day (QID) | ORAL | 0 refills | Status: DC | PRN

## 2020-05-07 MED ORDER — KETOROLAC TROMETHAMINE 30 MG/ML IJ SOLN
60.0000 mg | Freq: Once | INTRAMUSCULAR | Status: AC
  Administered 2020-05-07: 60 mg via INTRAMUSCULAR

## 2020-05-07 MED ORDER — TRAMADOL HCL 50 MG PO TABS: 50.0000 mg | ORAL_TABLET | Freq: Four times a day (QID) | ORAL | 0 refills | Status: DC | PRN

## 2020-05-07 MED ORDER — KETOROLAC TROMETHAMINE 60 MG/2ML IM SOLN: 60.0000 mg | Freq: Once | INTRAMUSCULAR | Status: DC

## 2020-05-07 NOTE — Progress Notes (Signed)
Sunday 05/05/20 pt syarting having LUQ pain that radiates around the back under left shoulder blade. Pt describes this pain as her insides are being twisted and poked with a "hot poker". Pt stated she started feeling nauseous Sunday night up until today she doesn't feel as nauseous. CL,RMA

## 2020-05-07 NOTE — Progress Notes (Signed)
   Subjective: Left flank pain with hematuria    Patient ID: Sara Fitzpatrick, female    DOB: [birth year], 49 y.o.   MRN: [id number]  HPI Patient presents with 3 days of increasing left flank pain.  Patient noticed hematuria today.  Patient the pain starts at the left flank and radiates to the lower left abdomen.  Patient denies dysuria.  Patient denies vaginal discharge or fever.  Patient rates the pain as 8/10.  Patient describes the pain as "sharp/achy".  No palliative measure for complaint.  Patient unable to sleep because her last night secondary to the pain.   Review of Systems     Anxiety and hypertension. Objective:   Physical Exam Moderate distress.  BMI is 42.62.  Moderate guarding palpation left flank.  Urine is positive for erythrocytes.       Assessment & Plan: Left flank pain with hematuria.  Patient complaining physical exam is consistent with kidney stone.  Patient will be further evaluated with a CT scan.  Patient given 60 mg of Toradol IM followed by oral medication for 5 days.  Patient also given a prescription for tramadol.  Patient will follow up status post CT scan.

## 2020-05-08 ENCOUNTER — Ambulatory Visit: Admission: RE | Admit: 2020-05-08 | Payer: 59 | Source: Ambulatory Visit

## 2020-05-10 ENCOUNTER — Ambulatory Visit
Admission: RE | Admit: 2020-05-10 | Discharge: 2020-05-10 | Disposition: A | Discharge status: Home / Self Care | Payer: 59 | Source: Ambulatory Visit | Attending: Physician Assistant | Admitting: Physician Assistant

## 2020-05-10 ENCOUNTER — Other Ambulatory Visit: Payer: Self-pay

## 2020-05-10 DIAGNOSIS — R319 Hematuria, unspecified: Secondary | ICD-10-CM | POA: Diagnosis not present

## 2020-05-10 DIAGNOSIS — R109 Unspecified abdominal pain: Secondary | ICD-10-CM | POA: Diagnosis not present

## 2020-05-20 ENCOUNTER — Ambulatory Visit: Payer: Self-pay | Admitting: Emergency Medicine

## 2020-05-20 ENCOUNTER — Other Ambulatory Visit: Payer: Self-pay

## 2020-05-20 ENCOUNTER — Encounter: Payer: Self-pay | Admitting: Emergency Medicine

## 2020-05-20 VITALS — BP 135/77 | HR 80 | Temp 97.8°F | Resp 16 | Ht 62.0 in | Wt 233.0 lb

## 2020-05-20 DIAGNOSIS — R109 Unspecified abdominal pain: Secondary | ICD-10-CM

## 2020-05-20 NOTE — Addendum Note (Signed)
Addended by: Gardner Candle on: 05/20/2020 12:37 PM   Modules accepted: Orders

## 2020-05-20 NOTE — Progress Notes (Signed)
Occupational Health Provider Note       Time seen: 10:34 AM    I have reviewed the vital signs and the nursing notes.  HISTORY   Chief Complaint Follow-up (continued discolored urine, lower back , lower pelvic, frequent urine)    HPI Sara Fitzpatrick is a 49 y.o. female with a history of blood clots, endometriosis, fibromyalgia, hypertension, pelvic pain who presents today for persistent left flank pain after recent negative outpatient CT.  She has seen some tissue when she urinates.  Denies fevers, chills or other complaints.  Past Medical History:  Diagnosis Date  . Blood clot in vein    right arm  . Endometriosis   . Fibromyalgia   . Hypertension   . Migraine   . Mild peripheral edema 01/18/2020  . Pelvic pain   . Stroke (cerebrum) [facility])     Past Surgical History:  Procedure Laterality Date  . ABDOMINAL HYSTERECTOMY     lsh  . CESAREAN SECTION    . LAPAROSCOPY     x 3    Allergies Codeine, Other, Hydrocodone-acetaminophen, and Morphine and related   Review of Systems Constitutional: Negative for fever. Cardiovascular: Negative for chest pain. Respiratory: Negative for shortness of breath. Gastrointestinal: Negative for abdominal pain, vomiting and diarrhea. Musculoskeletal: Positive for flank pain Skin: Negative for rash. Neurological: Negative for headaches, focal weakness or numbness.  All systems negative/normal/unremarkable except as stated in the HPI  ____________________________________________   PHYSICAL EXAM:  VITAL SIGNS: Vitals:   05/20/20 1020  BP: (!) 135/77  Pulse: 80  Resp: 16  Temp: 97.8 F (36.6 C)  SpO2: 99%    Constitutional: Alert and oriented. Well appearing and in no distress. Gastrointestinal: Mild left leg tenderness, no rebound or guarding. Musculoskeletal: Nontender with normal range of motion in extremities. No lower extremity tenderness nor edema. Neurologic:  Normal speech and language. No gross focal  neurologic deficits are appreciated.  Skin:  Skin is warm, dry and intact. No rash noted. Psychiatric: Speech and behavior are normal.   ____________________________________________   LABS (pertinent positives/negatives)  Recent Results (from the past 2160 hour(s))  Basic metabolic panel     Status: Abnormal   Collection Time: 04/09/20 10:47 AM  Result Value Ref Range   Sodium 139 135 - 145 mmol/L   Potassium 3.8 3.5 - 5.1 mmol/L   Chloride 102 98 - 111 mmol/L   CO2 27 22 - 32 mmol/L   Glucose, Bld 104 (H) 70 - 99 mg/dL    Comment: Glucose reference range applies only to samples taken after fasting for at least 8 hours.   BUN 10 6 - 20 mg/dL   Creatinine, Ser 6.21 0.44 - 1.00 mg/dL   Calcium 9.1 8.9 - 30.8 mg/dL   GFR calc non Af Amer >60 >60 mL/min   GFR calc Af Amer >60 >60 mL/min   Anion gap 10 5 - 15    Comment: Performed at [facility], [address] [place], [place] [address]  CBC     Status: None   Collection Time: 04/09/20 10:47 AM  Result Value Ref Range   WBC 6.7 4.0 - 10.5 K/uL   RBC 4.55 3.87 - 5.11 MIL/uL   Hemoglobin 14.0 12.0 - 15.0 g/dL   HCT 69.6 36 - 46 %   MCV 90.1 80.0 - 100.0 fL   MCH 30.8 26.0 - 34.0 pg   MCHC 34.1 30.0 - 36.0 g/dL   RDW 29.5 28.4 - 13.2 %  Platelets 190 150 - 400 K/uL   nRBC 0.0 0.0 - 0.2 %    Comment: Performed at [facility], [address] [place], [place] [address]  Troponin I (High Sensitivity)     Status: None   Collection Time: 04/09/20 10:47 AM  Result Value Ref Range   Troponin I (High Sensitivity) 3 <18 ng/L    Comment: (NOTE) Elevated high sensitivity troponin I (hsTnI) values and significant  changes across serial measurements may suggest ACS but many other  chronic and acute conditions are known to elevate hsTnI results.  Refer to the "Links" section for chest pain algorithms and additional  guidance. Performed at [facility], [address] [place], [place] [address]    Hepatic function panel     Status: None   Collection Time: 04/09/20 11:10 AM  Result Value Ref Range   Total Protein 7.3 6.5 - 8.1 g/dL   Albumin 4.1 3.5 - 5.0 g/dL   AST 25 15 - 41 U/L   ALT 19 0 - 44 U/L   Alkaline Phosphatase 66 38 - 126 U/L   Total Bilirubin 0.9 0.3 - 1.2 mg/dL   Bilirubin, Direct 0.2 0.0 - 0.2 mg/dL   Indirect Bilirubin 0.7 0.3 - 0.9 mg/dL    Comment: Performed at [facility], [address] [place], [place] [address]  Lipase, blood     Status: None   Collection Time: 04/09/20 11:10 AM  Result Value Ref Range   Lipase 24 11 - 51 U/L    Comment: Performed at [facility], [address] [place], [place] [address]  Fibrin derivatives D-[facility] only)     Status: None   Collection Time: 04/09/20 11:10 AM  Result Value Ref Range   Fibrin derivatives D-dimer ([facility]) 356.13 0.00 - 499.00 ng/mL (FEU)    Comment: (NOTE) <> Exclusion of Venous Thromboembolism (VTE) - OUTPATIENT ONLY   (Emergency Department or [place])    0-499 ng/ml (FEU): With a low to intermediate pretest probability                      for VTE this test result excludes the diagnosis                      of VTE.   >499 ng/ml (FEU) : VTE not excluded; additional work up for VTE is                      required.  <> Testing on Inpatients and Evaluation of Disseminated Intravascular   Coagulation (DIC) Reference Range:   0-499 ng/ml (FEU) Performed at [facility], [address] [place], [place] [address]   Urinalysis, Complete w Microscopic     Status: Abnormal   Collection Time: 04/09/20 11:27 AM  Result Value Ref Range   Color, Urine YELLOW (A) YELLOW   APPearance CLEAR (A) CLEAR   Specific Gravity, Urine 1.010 1.005 - 1.030   pH 6.0 5.0 - 8.0   Glucose, UA NEGATIVE NEGATIVE mg/dL   Hgb urine dipstick NEGATIVE NEGATIVE   Bilirubin Urine NEGATIVE NEGATIVE   Ketones, ur NEGATIVE NEGATIVE mg/dL   Protein, ur NEGATIVE NEGATIVE mg/dL   Nitrite NEGATIVE  NEGATIVE   Leukocytes,Ua NEGATIVE NEGATIVE   RBC / HPF 0-5 0 - 5 RBC/hpf   WBC, UA 0-5 0 - 5 WBC/hpf   Bacteria, UA MANY (A) NONE SEEN   Squamous Epithelial / LPF  0-5 0 - 5   Mucus PRESENT     Comment: Performed at [facility], [address] [place], [place] [address]  Urine culture     Status: Abnormal   Collection Time: 04/09/20 11:27 AM   Specimen: Urine, Random  Result Value Ref Range   Specimen Description      URINE, RANDOM Performed at [facility], [address] [place], [place] [address]    Special Requests      NONE Performed at [facility], [address] [place], [place] [address]    Culture MULTIPLE SPECIES PRESENT, SUGGEST RECOLLECTION (A)    Report Status 04/10/2020 FINAL   Troponin I (High Sensitivity)     Status: None   Collection Time: 04/09/20 12:36 PM  Result Value Ref Range   Troponin I (High Sensitivity) 3 <18 ng/L    Comment: (NOTE) Elevated high sensitivity troponin I (hsTnI) values and significant  changes across serial measurements may suggest ACS but many other  chronic and acute conditions are known to elevate hsTnI results.  Refer to the "Links" section for chest pain algorithms and additional  guidance. Performed at [facility], [address] [place], [place] [address]   POCT urinalysis dipstick     Status: Abnormal   Collection Time: 05/07/20  4:09 PM  Result Value Ref Range   Color, UA yellow    Clarity, UA cloudy    Glucose, UA Negative Negative   Bilirubin, UA negative    Ketones, UA negative    Spec Grav, UA 1.015 1.010 - 1.025   Blood, UA +-     Comment: 10 ery/uL   pH, UA 7.5 5.0 - 8.0   Protein, UA Negative Negative   Urobilinogen, UA 0.2 0.2 or 1.0 E.U./dL   Nitrite, UA negative    Leukocytes, UA Negative Negative   Appearance light    Odor       RADIOLOGY  Images were viewed by me CT renal protocol from 1 week ago IMPRESSION: 1. No findings to account for the  patient's history of flank pain or hematuria. Specifically, no urinary tract calculi no findings of urinary tract obstruction.   ASSESSMENT AND PLAN  Flank pain   Plan: The patient had presented for persistent left flank pain which may be musculoskeletal in origin.  Urinalysis here is not diagnostic, should be referred to urology for outpatient follow-up.  Daryel November MD    Note: This note was generated in part or whole with voice recognition software. Voice recognition is usually quite accurate but there are transcription errors that can and very often do occur. I apologize for any typographical errors that were not detected and corrected.

## 2020-05-20 NOTE — Progress Notes (Signed)
Add on comment pt has what seems to pink chunks of tissue left in the toilet when urinated.

## 2020-06-13 ENCOUNTER — Other Ambulatory Visit: Payer: Self-pay | Admitting: Emergency Medicine

## 2020-06-13 DIAGNOSIS — Z20828 Contact with and (suspected) exposure to other viral communicable diseases: Secondary | ICD-10-CM | POA: Diagnosis not present

## 2020-06-13 DIAGNOSIS — J069 Acute upper respiratory infection, unspecified: Secondary | ICD-10-CM | POA: Diagnosis not present

## 2020-06-13 DIAGNOSIS — F411 Generalized anxiety disorder: Secondary | ICD-10-CM

## 2021-02-27 NOTE — Progress Notes (Signed)
Scheduled to complete physical 03/04/21 with Viviano Simas, NP.  AMD

## 2021-02-28 ENCOUNTER — Other Ambulatory Visit: Payer: Self-pay

## 2021-02-28 ENCOUNTER — Ambulatory Visit: Payer: Self-pay

## 2021-02-28 DIAGNOSIS — Z0000 Encounter for general adult medical examination without abnormal findings: Secondary | ICD-10-CM

## 2021-02-28 LAB — POCT URINALYSIS DIPSTICK
Bilirubin, UA: NEGATIVE
Blood, UA: POSITIVE
Glucose, UA: NEGATIVE
Ketones, UA: NEGATIVE
Leukocytes, UA: NEGATIVE
Nitrite, UA: NEGATIVE
Protein, UA: NEGATIVE
Spec Grav, UA: 1.015 (ref 1.010–1.025)
Urobilinogen, UA: 0.2 E.U./dL
pH, UA: 7 (ref 5.0–8.0)

## 2021-03-01 LAB — CMP12+LP+TP+TSH+6AC+CBC/D/PLT
ALT: 20 IU/L (ref 0–32)
AST: 18 IU/L (ref 0–40)
Albumin/Globulin Ratio: 2.1 (ref 1.2–2.2)
Albumin: 4.6 g/dL (ref 3.8–4.8)
Alkaline Phosphatase: 78 IU/L (ref 44–121)
BUN/Creatinine Ratio: 14 (ref 9–23)
BUN: 14 mg/dL (ref 6–24)
Basophils Absolute: 0 10*3/uL (ref 0.0–0.2)
Basos: 0 %
Bilirubin Total: 0.5 mg/dL (ref 0.0–1.2)
Calcium: 9.4 mg/dL (ref 8.7–10.2)
Chloride: 101 mmol/L (ref 96–106)
Chol/HDL Ratio: 3.4 ratio (ref 0.0–4.4)
Cholesterol, Total: 229 mg/dL — ABNORMAL HIGH (ref 100–199)
Creatinine, Ser: 0.97 mg/dL (ref 0.57–1.00)
EOS (ABSOLUTE): 0.1 10*3/uL (ref 0.0–0.4)
Eos: 2 %
Estimated CHD Risk: 0.5 times avg. (ref 0.0–1.0)
Free Thyroxine Index: 2.2 (ref 1.2–4.9)
GGT: 13 IU/L (ref 0–60)
Globulin, Total: 2.2 g/dL (ref 1.5–4.5)
Glucose: 98 mg/dL (ref 65–99)
HDL: 67 mg/dL (ref >39)
Hematocrit: 40.2 % (ref 34.0–46.6)
Hemoglobin: 13.5 g/dL (ref 11.1–15.9)
Immature Grans (Abs): 0 10*3/uL (ref 0.0–0.1)
Immature Granulocytes: 0 %
Iron: 96 ug/dL (ref 27–159)
LDH: 249 IU/L — ABNORMAL HIGH (ref 119–226)
LDL Chol Calc (NIH): 148 mg/dL — ABNORMAL HIGH (ref 0–99)
Lymphocytes Absolute: 2 10*3/uL (ref 0.7–3.1)
Lymphs: 40 %
MCH: 30.5 pg (ref 26.6–33.0)
MCHC: 33.6 g/dL (ref 31.5–35.7)
MCV: 91 fL (ref 79–97)
Monocytes Absolute: 0.3 10*3/uL (ref 0.1–0.9)
Monocytes: 6 %
Neutrophils Absolute: 2.6 10*3/uL (ref 1.4–7.0)
Neutrophils: 52 %
Phosphorus: 2.7 mg/dL — ABNORMAL LOW (ref 3.0–4.3)
Platelets: 248 10*3/uL (ref 150–450)
Potassium: 4 mmol/L (ref 3.5–5.2)
RBC: 4.43 x10E6/uL (ref 3.77–5.28)
RDW: 12.9 % (ref 11.7–15.4)
Sodium: 142 mmol/L (ref 134–144)
T3 Uptake Ratio: 27 % (ref 24–39)
T4, Total: 8.3 ug/dL (ref 4.5–12.0)
TSH: 0.887 u[IU]/mL (ref 0.450–4.500)
Total Protein: 6.8 g/dL (ref 6.0–8.5)
Triglycerides: 82 mg/dL (ref 0–149)
Uric Acid: 5.1 mg/dL (ref 2.6–6.2)
VLDL Cholesterol Cal: 14 mg/dL (ref 5–40)
WBC: 5 10*3/uL (ref 3.4–10.8)
eGFR: 72 mL/min/{1.73_m2} (ref >59)

## 2021-03-01 LAB — HGB A1C W/O EAG: Hgb A1c MFr Bld: 5.6 % (ref 4.8–5.6)

## 2021-03-04 ENCOUNTER — Encounter: Payer: Self-pay | Admitting: Nurse Practitioner

## 2021-03-04 ENCOUNTER — Ambulatory Visit: Payer: Self-pay | Admitting: Nurse Practitioner

## 2021-03-04 ENCOUNTER — Other Ambulatory Visit: Payer: Self-pay

## 2021-03-04 VITALS — BP 135/81 | HR 79 | Temp 98.7°F | Resp 12 | Ht 62.0 in | Wt 222.0 lb

## 2021-03-04 DIAGNOSIS — Z0000 Encounter for general adult medical examination without abnormal findings: Secondary | ICD-10-CM

## 2021-03-04 NOTE — Progress Notes (Signed)
Subjective:    Patient ID: Sara Fitzpatrick, female    DOB: [birth year], 50 y.o.   MRN: [id number]  HPI  50 year old female presenting to [place] for annual physical. She has been actively trying to watch what she eats and exercise.   History includes endometriosis with a history of hysterectomy in 2010. HTN currently controlled on HCTZ  Depression currently managed on buspirone   She has been vaccinated and booster for COVID-19  Complaints of ongoing urinary frequency- was concerned for DM as it runs in her family.   Has ongoing gynecological discomfort since hysterectomy with ongoign endometrial issues. Currently at [facility] for OBGYN   Last Mammogram in system is from 2017   Today's Vitals   03/04/21 0945  BP: 135/81  Pulse: 79  Resp: 12  Temp: 98.7 F (37.1 C)  TempSrc: Oral  SpO2: 100%  Weight: 222 lb (100.7 kg)  Height: 5' 2"  (1.575 m)   Body mass index is 40.6 kg/m.;  Review of Systems  Constitutional: Negative.   HENT: Negative.   Respiratory: Negative.   Cardiovascular: Negative.   Gastrointestinal: Negative.   Genitourinary: Positive for frequency.  Skin: Negative.   Neurological: Negative.    Current Outpatient Medications  Medication Instructions  . aspirin EC 81 mg, Oral, Daily  . busPIRone (BUSPAR) 10 MG tablet TAKE 1 TABLET BY MOUTH THREE TIMES A DAY  . hydrochlorothiazide (HYDRODIURIL) 25 MG tablet 1 tablet, Oral, Daily      Objective:   Physical Exam Constitutional:      Appearance: Normal appearance.  HENT:     Head: Normocephalic.  Cardiovascular:     Rate and Rhythm: Normal rate and regular rhythm.     Heart sounds: Normal heart sounds.  Pulmonary:     Effort: Pulmonary effort is normal.     Breath sounds: Normal breath sounds.  Abdominal:     Palpations: Abdomen is soft.  Skin:    General: Skin is warm and dry.  Neurological:     General: No focal deficit present.     Mental Status: She is alert.  Psychiatric:         Mood and Affect: Mood normal.    Recent Results (from the past 2160 hour(s))  CMP12+LP+TP+TSH+6AC+CBC/D/Plt     Status: Abnormal   Collection Time: 02/28/21  9:01 AM  Result Value Ref Range   Glucose 98 65 - 99 mg/dL   Uric Acid 5.1 2.6 - 6.2 mg/dL    Comment:            Therapeutic target for gout patients: <6.0   BUN 14 6 - 24 mg/dL   Creatinine, Ser 0.97 0.57 - 1.00 mg/dL   eGFR 72 >59 mL/min/1.73   BUN/Creatinine Ratio 14 9 - 23   Sodium 142 134 - 144 mmol/L   Potassium 4.0 3.5 - 5.2 mmol/L   Chloride 101 96 - 106 mmol/L   Calcium 9.4 8.7 - 10.2 mg/dL   Phosphorus 2.7 (L) 3.0 - 4.3 mg/dL   Total Protein 6.8 6.0 - 8.5 g/dL   Albumin 4.6 3.8 - 4.8 g/dL   Globulin, Total 2.2 1.5 - 4.5 g/dL   Albumin/Globulin Ratio 2.1 1.2 - 2.2   Bilirubin Total 0.5 0.0 - 1.2 mg/dL   Alkaline Phosphatase 78 44 - 121 IU/L   LDH 249 (H) 119 - 226 IU/L   AST 18 0 - 40 IU/L   ALT 20 0 - 32 IU/L   GGT  13 0 - 60 IU/L   Iron 96 27 - 159 ug/dL   Cholesterol, Total 229 (H) 100 - 199 mg/dL   Triglycerides 82 0 - 149 mg/dL   HDL 67 >39 mg/dL   VLDL Cholesterol Cal 14 5 - 40 mg/dL   LDL Chol Calc (NIH) 148 (H) 0 - 99 mg/dL   Chol/HDL Ratio 3.4 0.0 - 4.4 ratio    Comment:                                   T. Chol/HDL Ratio                                             Men  Women                               1/2 Avg.Risk  3.4    3.3                                   Avg.Risk  5.0    4.4                                2X Avg.Risk  9.6    7.1                                3X Avg.Risk 23.4   11.0    Estimated CHD Risk 0.5 0.0 - 1.0 times avg.    Comment: The CHD Risk is based on the T. Chol/HDL ratio. Other factors affect CHD Risk such as hypertension, smoking, diabetes, severe obesity, and family history of premature CHD.    TSH 0.887 0.450 - 4.500 uIU/mL   T4, Total 8.3 4.5 - 12.0 ug/dL   T3 Uptake Ratio 27 24 - 39 %   Free Thyroxine Index 2.2 1.2 - 4.9   WBC 5.0 3.4 - 10.8 x10E3/uL    RBC 4.43 3.77 - 5.28 x10E6/uL   Hemoglobin 13.5 11.1 - 15.9 g/dL   Hematocrit 40.2 34.0 - 46.6 %   MCV 91 79 - 97 fL   MCH 30.5 26.6 - 33.0 pg   MCHC 33.6 31.5 - 35.7 g/dL   RDW 12.9 11.7 - 15.4 %   Platelets 248 150 - 450 x10E3/uL   Neutrophils 52 Not Estab. %   Lymphs 40 Not Estab. %   Monocytes 6 Not Estab. %   Eos 2 Not Estab. %   Basos 0 Not Estab. %   Neutrophils Absolute 2.6 1.4 - 7.0 x10E3/uL   Lymphocytes Absolute 2.0 0.7 - 3.1 x10E3/uL   Monocytes Absolute 0.3 0.1 - 0.9 x10E3/uL   EOS (ABSOLUTE) 0.1 0.0 - 0.4 x10E3/uL   Basophils Absolute 0.0 0.0 - 0.2 x10E3/uL   Immature Granulocytes 0 Not Estab. %   Immature Grans (Abs) 0.0 0.0 - 0.1 x10E3/uL  Hgb A1c w/o eAG     Status: None   Collection Time: 02/28/21  9:01 AM  Result Value Ref Range   Hgb A1c MFr Bld 5.6 4.8 - 5.6 %  Comment:          Prediabetes: 5.7 - 6.4          Diabetes: >6.4          Glycemic control for adults with diabetes: <7.0   POCT urinalysis dipstick     Status: None   Collection Time: 02/28/21  9:29 AM  Result Value Ref Range   Color, UA Yellow    Clarity, UA Clear    Glucose, UA Negative Negative   Bilirubin, UA Negative    Ketones, UA Negative    Spec Grav, UA 1.015 1.010 - 1.025   Blood, UA Positive     Comment: 1+   pH, UA 7.0 5.0 - 8.0   Protein, UA Negative Negative   Urobilinogen, UA 0.2 0.2 or 1.0 E.U./dL   Nitrite, UA Negative    Leukocytes, UA Negative Negative   Appearance     Odor      Reviewed labs with patient Hematuria 1+ chronic reviewed CT renal scan from 04/2020, denies pain Total Cholesterol and LDL elevated      Assessment & Plan:  Discussed with patient to follow up with OBGYN regarding ongoing urinary complaints. Reviewed imaging from years past may recommend f/u ultrasound of abdomen if no CT scan to evaluate post surgical changes as most recent ultrasound and CT are not consistent.   Continue diet and exercise encouraged weight bearing exercises with  patient.   Ordered Mammogram patient to schedule   Continue HCTZ electrolytes stable

## 2021-05-13 ENCOUNTER — Ambulatory Visit: Payer: Self-pay | Admitting: Physician Assistant

## 2021-05-13 ENCOUNTER — Other Ambulatory Visit: Payer: Self-pay

## 2021-05-13 ENCOUNTER — Encounter: Payer: Self-pay | Admitting: Physician Assistant

## 2021-05-13 VITALS — BP 138/79 | HR 79 | Temp 97.7°F | Resp 14 | Ht 62.0 in | Wt 220.0 lb

## 2021-05-13 DIAGNOSIS — H6122 Impacted cerumen, left ear: Secondary | ICD-10-CM

## 2021-05-13 NOTE — Progress Notes (Signed)
   Subjective: Hearing loss    Patient ID: Sara Fitzpatrick, female    DOB: [birth year], 51 y.o.   MRN: [id number]  HPI Patient presents for hair loss mostly in the right ear.  Patient states she has a history of wax buildup.  Denies vertigo.   Review of Systems Negative except for complaint    Objective:   Physical Exam No acute distress.  Temperature 97.7, pulse 79, respiration 14, BP is 138/79, patient is 100% O2 sat on room air.  Patient weighs 220 pounds and BMI is 40.2. HEENT is remarkable for cerumen impaction of the right ear.       Assessment & Plan: Hearing loss secondary to cerumen impaction.   Irrigation of right ear is warranted.  Patient given consent and discussed  possibility of mild vertigo status postprocedure.  TM visible status post procedure.  Patient states hearing is back to normal.

## 2021-05-13 NOTE — Progress Notes (Signed)
Pt has wax build up in both ears. Mostly right ear.

## 2021-06-18 DIAGNOSIS — M797 Fibromyalgia: Secondary | ICD-10-CM | POA: Insufficient documentation

## 2021-06-18 DIAGNOSIS — M79641 Pain in right hand: Secondary | ICD-10-CM | POA: Diagnosis not present

## 2021-06-18 DIAGNOSIS — M545 Low back pain, unspecified: Secondary | ICD-10-CM | POA: Insufficient documentation

## 2021-07-17 ENCOUNTER — Other Ambulatory Visit: Payer: Self-pay

## 2021-07-17 DIAGNOSIS — F411 Generalized anxiety disorder: Secondary | ICD-10-CM

## 2021-07-18 MED ORDER — BUSPIRONE HCL 10 MG PO TABS: 10.0000 mg | ORAL_TABLET | Freq: Three times a day (TID) | ORAL | 3 refills | Status: DC

## 2021-08-07 DIAGNOSIS — J029 Acute pharyngitis, unspecified: Secondary | ICD-10-CM | POA: Diagnosis not present

## 2021-08-07 DIAGNOSIS — U071 COVID-19: Secondary | ICD-10-CM | POA: Diagnosis not present

## 2021-08-07 DIAGNOSIS — J019 Acute sinusitis, unspecified: Secondary | ICD-10-CM | POA: Diagnosis not present

## 2021-08-07 DIAGNOSIS — J209 Acute bronchitis, unspecified: Secondary | ICD-10-CM | POA: Diagnosis not present

## 2021-08-07 DIAGNOSIS — Z03818 Encounter for observation for suspected exposure to other biological agents ruled out: Secondary | ICD-10-CM | POA: Diagnosis not present

## 2021-08-19 ENCOUNTER — Other Ambulatory Visit: Payer: Self-pay

## 2021-08-19 DIAGNOSIS — I10 Essential (primary) hypertension: Secondary | ICD-10-CM

## 2021-08-19 MED ORDER — HYDROCHLOROTHIAZIDE 25 MG PO TABS: 25.0000 mg | ORAL_TABLET | Freq: Every day | ORAL | 3 refills | Status: DC

## 2021-11-05 DIAGNOSIS — L820 Inflamed seborrheic keratosis: Secondary | ICD-10-CM | POA: Diagnosis not present

## 2021-11-05 DIAGNOSIS — L538 Other specified erythematous conditions: Secondary | ICD-10-CM | POA: Diagnosis not present

## 2021-11-05 DIAGNOSIS — D225 Melanocytic nevi of trunk: Secondary | ICD-10-CM | POA: Diagnosis not present

## 2021-11-05 DIAGNOSIS — D2272 Melanocytic nevi of left lower limb, including hip: Secondary | ICD-10-CM | POA: Diagnosis not present

## 2021-11-05 DIAGNOSIS — L821 Other seborrheic keratosis: Secondary | ICD-10-CM | POA: Diagnosis not present

## 2021-11-05 DIAGNOSIS — D2262 Melanocytic nevi of left upper limb, including shoulder: Secondary | ICD-10-CM | POA: Diagnosis not present

## 2021-11-05 DIAGNOSIS — D485 Neoplasm of uncertain behavior of skin: Secondary | ICD-10-CM | POA: Diagnosis not present

## 2021-11-05 DIAGNOSIS — D2261 Melanocytic nevi of right upper limb, including shoulder: Secondary | ICD-10-CM | POA: Diagnosis not present

## 2021-11-05 DIAGNOSIS — D2271 Melanocytic nevi of right lower limb, including hip: Secondary | ICD-10-CM | POA: Diagnosis not present

## 2021-11-27 DIAGNOSIS — D225 Melanocytic nevi of trunk: Secondary | ICD-10-CM | POA: Diagnosis not present

## 2022-01-27 ENCOUNTER — Ambulatory Visit: Payer: Self-pay

## 2022-01-27 DIAGNOSIS — Z0000 Encounter for general adult medical examination without abnormal findings: Secondary | ICD-10-CM

## 2022-01-27 LAB — POCT URINALYSIS DIPSTICK
Bilirubin, UA: NEGATIVE
Blood, UA: NEGATIVE
Glucose, UA: NEGATIVE
Ketones, UA: NEGATIVE
Leukocytes, UA: NEGATIVE
Nitrite, UA: NEGATIVE
Protein, UA: NEGATIVE
Spec Grav, UA: 1.015 (ref 1.010–1.025)
Urobilinogen, UA: 0.2 E.U./dL
pH, UA: 7.5 (ref 5.0–8.0)

## 2022-01-28 LAB — CMP12+LP+TP+TSH+6AC+CBC/D/PLT
ALT: 16 IU/L (ref 0–32)
AST: 19 IU/L (ref 0–40)
Albumin/Globulin Ratio: 1.6 (ref 1.2–2.2)
Albumin: 4.5 g/dL (ref 3.8–4.8)
Alkaline Phosphatase: 77 IU/L (ref 44–121)
BUN/Creatinine Ratio: 9 (ref 9–23)
BUN: 9 mg/dL (ref 6–24)
Basophils Absolute: 0 10*3/uL (ref 0.0–0.2)
Basos: 1 %
Bilirubin Total: 0.4 mg/dL (ref 0.0–1.2)
Calcium: 9.6 mg/dL (ref 8.7–10.2)
Chloride: 100 mmol/L (ref 96–106)
Chol/HDL Ratio: 3.7 ratio (ref 0.0–4.4)
Cholesterol, Total: 216 mg/dL — ABNORMAL HIGH (ref 100–199)
Creatinine, Ser: 1.03 mg/dL — ABNORMAL HIGH (ref 0.57–1.00)
EOS (ABSOLUTE): 0.1 10*3/uL (ref 0.0–0.4)
Eos: 1 %
Estimated CHD Risk: 0.7 times avg. (ref 0.0–1.0)
Free Thyroxine Index: 2.9 (ref 1.2–4.9)
GGT: 12 IU/L (ref 0–60)
Globulin, Total: 2.8 g/dL (ref 1.5–4.5)
Glucose: 90 mg/dL (ref 70–99)
HDL: 58 mg/dL (ref >39)
Hematocrit: 42.6 % (ref 34.0–46.6)
Hemoglobin: 14.5 g/dL (ref 11.1–15.9)
Immature Grans (Abs): 0 10*3/uL (ref 0.0–0.1)
Immature Granulocytes: 0 %
Iron: 69 ug/dL (ref 27–159)
LDH: 231 IU/L — ABNORMAL HIGH (ref 119–226)
LDL Chol Calc (NIH): 141 mg/dL — ABNORMAL HIGH (ref 0–99)
Lymphocytes Absolute: 1.9 10*3/uL (ref 0.7–3.1)
Lymphs: 39 %
MCH: 30.3 pg (ref 26.6–33.0)
MCHC: 34 g/dL (ref 31.5–35.7)
MCV: 89 fL (ref 79–97)
Monocytes Absolute: 0.3 10*3/uL (ref 0.1–0.9)
Monocytes: 6 %
Neutrophils Absolute: 2.5 10*3/uL (ref 1.4–7.0)
Neutrophils: 53 %
Phosphorus: 2.8 mg/dL — ABNORMAL LOW (ref 3.0–4.3)
Platelets: 275 10*3/uL (ref 150–450)
Potassium: 3.7 mmol/L (ref 3.5–5.2)
RBC: 4.79 x10E6/uL (ref 3.77–5.28)
RDW: 13.1 % (ref 11.7–15.4)
Sodium: 140 mmol/L (ref 134–144)
T3 Uptake Ratio: 28 % (ref 24–39)
T4, Total: 10.3 ug/dL (ref 4.5–12.0)
TSH: 0.709 u[IU]/mL (ref 0.450–4.500)
Total Protein: 7.3 g/dL (ref 6.0–8.5)
Triglycerides: 93 mg/dL (ref 0–149)
Uric Acid: 4.7 mg/dL (ref 2.6–6.2)
VLDL Cholesterol Cal: 17 mg/dL (ref 5–40)
WBC: 4.8 10*3/uL (ref 3.4–10.8)
eGFR: 67 mL/min/{1.73_m2} (ref >59)

## 2022-01-28 LAB — HGB A1C W/O EAG: Hgb A1c MFr Bld: 5.2 % (ref 4.8–5.6)

## 2022-02-02 ENCOUNTER — Ambulatory Visit: Payer: Self-pay | Admitting: Physician Assistant

## 2022-02-02 ENCOUNTER — Ambulatory Visit
Admission: RE | Admit: 2022-02-02 | Discharge: 2022-02-02 | Disposition: A | Discharge status: Home / Self Care | Payer: 59 | Source: Ambulatory Visit | Attending: Physician Assistant | Admitting: Physician Assistant

## 2022-02-02 ENCOUNTER — Encounter: Payer: Self-pay | Admitting: Physician Assistant

## 2022-02-02 VITALS — BP 128/91 | HR 85 | Temp 98.0°F | Resp 20 | Ht 62.0 in | Wt 202.0 lb

## 2022-02-02 DIAGNOSIS — M25552 Pain in left hip: Secondary | ICD-10-CM

## 2022-02-02 DIAGNOSIS — M545 Low back pain, unspecified: Secondary | ICD-10-CM | POA: Diagnosis not present

## 2022-02-02 DIAGNOSIS — Z90711 Acquired absence of uterus with remaining cervical stump: Secondary | ICD-10-CM | POA: Diagnosis not present

## 2022-02-02 DIAGNOSIS — Z0000 Encounter for general adult medical examination without abnormal findings: Secondary | ICD-10-CM

## 2022-02-02 DIAGNOSIS — R102 Pelvic and perineal pain: Secondary | ICD-10-CM | POA: Diagnosis not present

## 2022-02-02 MED ORDER — KETOROLAC TROMETHAMINE 10 MG PO TABS: 10.0000 mg | ORAL_TABLET | Freq: Four times a day (QID) | ORAL | 0 refills | Status: DC | PRN

## 2022-02-02 MED ORDER — KETOROLAC TROMETHAMINE 60 MG/2ML IM SOLN
60.0000 mg | Freq: Once | INTRAMUSCULAR | Status: AC
  Administered 2022-02-02: 60 mg via INTRAMUSCULAR

## 2022-02-02 MED ORDER — TRAMADOL HCL 50 MG PO TABS: 50.0000 mg | ORAL_TABLET | Freq: Four times a day (QID) | ORAL | 0 refills | Status: DC | PRN

## 2022-02-02 NOTE — Progress Notes (Signed)
Wednesday started having pain to groin area , low back, left femur area.  Causing left thigh to feel numb & painful. ?No known injury ?Can't lay on that side ?Hx of kidney stones & ovarian cysts ?Said clothing touching the skin is uncomfortable ?Hot tub helped some ?Tylenol isn't helping ? ?Ozempic is up to 2 mg ?Prescribed by Dr. Daryel November - Migraines & Pre-diabetic ?Lost 20 lbs since she started it ?Started taking January 11th ? ?AMD ? ?

## 2022-02-02 NOTE — Progress Notes (Signed)
?[facility] ?{____________________________________________ ? ? None  ?  (approximate) ? ?I have reviewed the triage vital signs and the nursing notes. ? ? ?HISTORY ? ?Chief Complaint ?Annual Exam and Leg Pain (Left thigh & groin) ? ? ?HPI ?Sara Fitzpatrick is a 51 y.o. female patient presents for annual health exam.  Patient voices concern for acute left pelvic pain, back pain, and radiculopathy to the left lateral thigh.  Onset of these complaints of 5 days ago with no provocative incident.  Patient has history of, Raynaud's phenomena, endometriosis, Hypertension,kidney stones,and ovarian cysts, and fibromyalgia.  Patient is class III obesity currently taking Ozempic which is reducing her weight.  Patient is in moderate distress and is laying on her right side secondary to her complaint of left pelvic pain.  Rates her pain as a 10/10.  States mild relief sitting in a hot tub.  No other palliative measure for complaint. ?   ? ?  ? ? ?Past Medical History:  ?Diagnosis Date  ? Blood clot in vein   ? right arm  ? Endometriosis   ? Fibromyalgia   ? Hypertension   ? Migraine   ? Mild peripheral edema 01/18/2020  ? Pelvic pain   ? Stroke (cerebrum) ([place])   ? ? ?Patient Active Problem List  ? Diagnosis Date Noted  ? Low back pain 06/18/2021  ? Fibromyalgia 06/18/2021  ? Class 3 severe obesity due to excess calories without serious comorbidity with body mass index (BMI) of 40.0 to 44.9 in adult [facility]) 03/19/2020  ? Hypertension, essential 02/12/2020  ? Mild peripheral edema 01/18/2020  ? Chest pain at rest 04/24/2018  ? Ischemic finger 06/03/2017  ? Embolism and thrombosis of arteries of upper extremities ([place]) 06/03/2017  ? Pain in finger of right hand 06/03/2017  ? Raynaud phenomenon 06/03/2017  ? History of cesarean section 09/01/2016  ? Status post laparoscopic supracervical hysterectomy 09/01/2016  ? Pelvic pain 09/01/2016  ? Endometriosis 09/01/2016  ? ? ?Past Surgical History:   ?Procedure Laterality Date  ? ABDOMINAL HYSTERECTOMY    ? lsh  ? CESAREAN SECTION    ? LAPAROSCOPY    ? x 3  ? ? ?Prior to Admission medications   ?Medication Sig Start Date End Date Taking? Authorizing Provider  ?busPIRone (BUSPAR) 10 MG tablet Take 1 tablet (10 mg total) by mouth 3 (three) times daily. 07/18/21  Yes Sable Feil, PA-C  ?hydrochlorothiazide (HYDRODIURIL) 25 MG tablet Take 1 tablet (25 mg total) by mouth daily. 08/19/21  Yes Sable Feil, PA-C  ?OZEMPIC, 1 MG/DOSE, 4 MG/3ML SOPN Inject into the skin. 01/21/22  Yes [provider]  ?aspirin EC 81 MG tablet Take 81 mg by mouth daily. ?Patient not taking: Reported on 02/02/2022    [provider]  ? ? ?Allergies ?Codeine, Other, Hydrocodone-acetaminophen, and Morphine and related ? ?Family History  ?Problem Relation Age of Onset  ? Diabetes Mother   ? Heart disease Mother   ? Heart disease Father   ? Leukemia Maternal Grandmother   ? Breast cancer Maternal Grandmother   ?     60-70  ? Heart disease Maternal Grandmother   ? Colon cancer Maternal Grandfather   ? Heart failure Maternal Grandfather   ? Heart disease Maternal Grandfather   ? Heart failure Paternal Grandmother   ? Heart disease Paternal Grandmother   ? Heart disease Paternal Grandfather   ? Ovarian cancer Neg Hx   ? ? ?Social History ?Social  History  ? ?Tobacco Use  ? Smoking status: Former  ? Smokeless tobacco: Never  ?Vaping Use  ? Vaping Use: Every day  ?Substance Use Topics  ? Alcohol use: Yes  ?  Comment: occasionally   ? Drug use: Never  ? ? ?Review of Systems ?Constitutional: No fever/chills ?Eyes: No visual changes. ?ENT: No sore throat. ?Cardiovascular: Denies chest pain. ?Respiratory: Denies shortness of breath. ?Gastrointestinal: No abdominal pain.  No nausea, no vomiting.  No diarrhea.  No constipation. ?Genitourinary: Negative for dysuria. ?Musculoskeletal: Negative for back pain. ?Skin: Negative for rash. ?Neurological: Negative for headaches, but states  left lateral thigh numbness.   ?Endocrine: Hypertension  ?Allergic/Immunilogical: Codeine and morphine. ?____________________________________________ ? ? ?PHYSICAL EXAM: ? ?VITAL SIGNS: Temperature is 98, respiration 20, pulse 85, BP is 128 over 91, patient 90% O2 sat on room air.  Patient weighs 202 pounds and BMI is 36.95. ?Constitutional: Alert and oriented. Well appearing and in no acute distress. ?Eyes: Conjunctivae are normal. PERRL. EOMI. ?Head: Atraumatic. ?Nose: No congestion/rhinnorhea. ?Mouth/Throat: Mucous membranes are moist.  Oropharynx non-erythematous. ?Neck: No stridor.  No cervical spine tenderness to palpation. ?Hematological/Lymphatic/Immunilogical: No cervical lymphadenopathy. ?Cardiovascular: Normal rate, regular rhythm. Grossly normal heart sounds.  Good peripheral circulation. ?Respiratory: Normal respiratory effort.  No retractions. Lungs CTAB. ?Gastrointestinal: Soft and nontender.  Secondary to body habitus. No abdominal bruits. No CVA tenderness.  Moderate guarding with palpation left lower pelvic. ?Genitourinary: Deferred ?Musculoskeletal: No lower extremity tenderness nor edema.  No joint effusions. ?Neurologic:  Normal speech and language. No gross focal neurologic deficits are appreciated. No gait instability. ?Skin:  Skin is warm, dry and intact. No rash noted. ?Psychiatric: Mood and affect are normal. Speech and behavior are normal. ? ?____________________________________________ ?  ?LABS ? ?___ ?         ?Component Ref Range & Units 6 d ago ?(01/27/22) 11 mo ago ?(02/28/21) 1 yr ago ?(05/07/20) 1 yr ago ?(04/09/20) 1 yr ago ?(02/19/20) 4 yr ago ?(10/24/17)  ?Color, UA  Light Yellow  Yellow  yellow   Yellow    ?Clarity, UA  Clear  Clear  cloudy   Clear    ?Glucose, UA Negative Negative  Negative  Negative   Negative    ?Bilirubin, UA  Negative  Negative  negative   Negative    ?Ketones, UA  Negative  Negative  negative   Negative    ?Spec Grav, UA 1.010 - 1.025 1.015  1.015  1.015   1.020     ?Blood, UA  Negative  Positive CM  +- CM   Negative    ?pH, UA 5.0 - 8.0 7.5  7.0  7.5   6.0    ?Protein, UA Negative Negative  Negative  Negative   Negative    ?Urobilinogen, UA 0.2 or 1.0 E.U./dL 0.2  0.2  0.2   0.2    ?Nitrite, UA  Negative  Negative  negative   Negative    ?Leukocytes, UA Negative Negative  Negative  Negative   Negative  NEGATIVE R   ?Appearance     light  CLEAR Abnormal  R    CLEAR Abnormal  R   ?Odor            ?Resulting Agency     [facility]  [facility]  ?  ? ?  ?  ?Specimen Collected: 01/27/22 10:52 Last Resulted: 01/27/22 10:52  ?  ?  Lab Flowsheet   ? Order Details   ? View Encounter   ?  Lab and Collection Details   ? Routing   ? Result History    ?View Encounter Conversation    ?  ?CM=Additional comments  R=Reference range differs from displayed range    ?  ?Result Care Coordination ? ? ?Patient Communication ? ? Add Comments   Seen Back to Top  ?  ?  ? ?Other Results from 01/27/2022 ? ? Contains abnormal data CMP12+LP+TP+TSH+6AC+CBC/D/Plt ?Order: [id number] ?Status: Final result    ?Visible to patient: Yes (seen)    ?Next appt: Today at 11:00 AM in [facility])    ?Dx: Routine adult health maintenance    ?0 Result Notes ?           ?Component Ref Range & Units 6 d ago ?(01/27/22) 11 mo ago ?(02/28/21) 1 yr ago ?(04/09/20) 1 yr ago ?(04/09/20) 1 yr ago ?(04/09/20) 1 yr ago ?(02/19/20) 2 yr ago ?(01/17/20) 2 yr ago ?(01/17/20)  ?Glucose 70 - 99 mg/dL 90  98 R   104 High  CM   98 R  106 High  CM    ?Uric Acid 2.6 - 6.2 mg/dL 4.7  5.1 CM     5.7 CM     ?Comment:            Therapeutic target for gout patients: <6.0  ?BUN 6 - 24 mg/dL _0 R   12  13 R    ?Creatinine, Ser 0.57 - 1.00 mg/dL 1.03 High   0.97   0.97 R   0.91  1.17 High  R    ?eGFR >59 mL/min/1.73 67  72         ?BUN/Creatinine Ratio 9 - _1 ?Sodium 134 - 144 mmol/L 140  142   139 R   141  140 R    ?Potassium 3.5 - 5.2 mmol/L 3.7  4.0   3.8 R   3.6  3.5 R    ?Chloride 96 - 106 mmol/L 100  101   102 R   101   105 R    ?Calcium 8.7 - 10.2 mg/dL 9.6  9.4   9.1 R   9.0  8.9 R    ?Phosphorus 3.0 - 4.3 mg/dL 2.8 Low   2.7 Low      2.8 Low      ?Total Protein 6.0 - 8.5 g/dL 7.3  6.8  7.3 R    6.4  7.5 R    ?Albumin 3.

## 2022-02-03 ENCOUNTER — Encounter: Payer: Self-pay | Admitting: Physician Assistant

## 2022-02-03 ENCOUNTER — Ambulatory Visit: Payer: Self-pay | Admitting: Physician Assistant

## 2022-02-03 DIAGNOSIS — M792 Neuralgia and neuritis, unspecified: Secondary | ICD-10-CM

## 2022-02-03 MED ORDER — LIDOCAINE 5 % EX PTCH: 1.0000 | MEDICATED_PATCH | CUTANEOUS | 0 refills | Status: DC

## 2022-02-03 MED ORDER — METHYLPREDNISOLONE SODIUM SUCC 40 MG IJ SOLR
80.0000 mg | Freq: Once | INTRAMUSCULAR | Status: AC
  Administered 2022-02-03: 80 mg via INTRAMUSCULAR

## 2022-02-03 MED ORDER — METHYLPREDNISOLONE 4 MG PO TBPK: ORAL_TABLET | ORAL | 0 refills | Status: DC

## 2022-02-03 MED ORDER — LIDOCAINE 5 % EX PTCH
1.0000 | MEDICATED_PATCH | CUTANEOUS | Status: DC
  Administered 2022-02-03: 1 via TRANSDERMAL

## 2022-02-03 NOTE — Progress Notes (Signed)
Neurology referral faxed to Dr. Sherryll Burger at [facility] at ?[phone]. ? ?Tawney states that she has seen Dr. Sherryll Burger before & prefers to stay with him. ? ?AMD ?

## 2022-02-03 NOTE — Progress Notes (Signed)
? ?[facility] ?Emergency Department Provider Note ? ?____________________________________________ ? ? None  ?  (approximate) ? ?I have reviewed the triage vital signs and the nursing notes. ? ? ?HISTORY ? ?Chief Complaint ?Results ? ? ?HPI ?Sara Fitzpatrick is a 51 y.o. female pain with intermitting numbness to the left lateral thigh.  Onset of complaint 6 days ago without any provocative incident.  Patient has a history of mild phenomena, endometriosis, hypertension, kidney stones, ovarian cyst, fibromyalgia.  Patient is a class III obesity and is currently taking Ozempic for weight loss. ?   ? ?  ? ? ?Past Medical History:  ?Diagnosis Date  ? Blood clot in vein   ? right arm  ? Endometriosis   ? Fibromyalgia   ? Hypertension   ? Migraine   ? Mild peripheral edema 01/18/2020  ? Pelvic pain   ? Stroke (cerebrum) ([place])   ? ? ?Patient Active Problem List  ? Diagnosis Date Noted  ? Low back pain 06/18/2021  ? Fibromyalgia 06/18/2021  ? Class 3 severe obesity due to excess calories without serious comorbidity with body mass index (BMI) of 40.0 to 44.9 in adult [facility]) 03/19/2020  ? Hypertension, essential 02/12/2020  ? Mild peripheral edema 01/18/2020  ? Chest pain at rest 04/24/2018  ? Ischemic finger 06/03/2017  ? Embolism and thrombosis of arteries of upper extremities ([place]) 06/03/2017  ? Pain in finger of right hand 06/03/2017  ? Raynaud phenomenon 06/03/2017  ? History of cesarean section 09/01/2016  ? Status post laparoscopic supracervical hysterectomy 09/01/2016  ? Pelvic pain 09/01/2016  ? Endometriosis 09/01/2016  ? ? ?Past Surgical History:  ?Procedure Laterality Date  ? ABDOMINAL HYSTERECTOMY    ? lsh  ? CESAREAN SECTION    ? LAPAROSCOPY    ? x 3  ? ? ?Prior to Admission medications   ?Medication Sig Start Date End Date Taking? Authorizing Provider  ?aspirin EC 81 MG tablet Take 81 mg by mouth daily. ?Patient not taking: Reported on 02/02/2022    [provider]  ?busPIRone  (BUSPAR) 10 MG tablet Take 1 tablet (10 mg total) by mouth 3 (three) times daily. 07/18/21   Sable Feil, PA-C  ?hydrochlorothiazide (HYDRODIURIL) 25 MG tablet Take 1 tablet (25 mg total) by mouth daily. 08/19/21   Sable Feil, PA-C  ?ketorolac (TORADOL) 10 MG tablet Take 1 tablet (10 mg total) by mouth every 6 (six) hours as needed. 02/02/22   Sable Feil, PA-C  ?OZEMPIC, 1 MG/DOSE, 4 MG/3ML SOPN Inject into the skin. 01/21/22   [provider]  ?traMADol (ULTRAM) 50 MG tablet Take 1 tablet (50 mg total) by mouth every 6 (six) hours as needed for moderate pain. 02/02/22   Sable Feil, PA-C  ? ? ?Allergies ?Codeine, Other, Hydrocodone-acetaminophen, and Morphine and related ? ?Family History  ?Problem Relation Age of Onset  ? Diabetes Mother   ? Heart disease Mother   ? Heart disease Father   ? Leukemia Maternal Grandmother   ? Breast cancer Maternal Grandmother   ?     60-70  ? Heart disease Maternal Grandmother   ? Colon cancer Maternal Grandfather   ? Heart failure Maternal Grandfather   ? Heart disease Maternal Grandfather   ? Heart failure Paternal Grandmother   ? Heart disease Paternal Grandmother   ? Heart disease Paternal Grandfather   ? Ovarian cancer Neg Hx   ? ? ?Social History ?Social History  ? ?Tobacco Use  ?  Smoking status: Former  ? Smokeless tobacco: Never  ?Vaping Use  ? Vaping Use: Every day  ?Substance Use Topics  ? Alcohol use: Yes  ?  Comment: occasionally   ? Drug use: Never  ? ? ?Review of Systems ?Eyes: No visual changes. ?ENT: No sore throat. ?Cardiovascular: Denies chest pain. ?Respiratory: Denies shortness of breath. ?Gastrointestinal: No abdominal pain.  No nausea, no vomiting.  No diarrhea.  No constipation. ?Genitourinary: Negative for dysuria. ?Musculoskeletal: Negative for back pain. ?Skin: Negative for rash. ?Neurological: Numbness to left lateral thigh.   ?Endocrine: Hypertension ?Hematological/Lymphatic:  ?Allergic/Immunilogical: Codeine  morphine ? ?____________________________________________ ? ? ?PHYSICAL EXAM: ? ?VITAL SIGNS: Temperature is 98, respiration 20, pulse 85, and BP is 129/81.  Patient weighs 202 pounds and BMI is 36.95. ?Constitutional: Alert and oriented.  Mild distress.   ?Eyes: Conjunctivae are normal. PERRL. EOMI. ?Head: Atraumatic. ?Nose: No congestion/rhinnorhea. ?Mouth/Throat: Mucous membranes are moist.  Oropharynx non-erythematous. ?Neck: No stridor.  No cervical spine tenderness to palpation. ?Hematological/Lymphatic/Immunilogical: No cervical lymphadenopathy. ?Cardiovascular: Normal rate, regular rhythm. Grossly normal heart sounds.  Good peripheral circulation. ?Respiratory: Normal respiratory effort.  No retractions. Lungs CTAB. ?Gastrointestinal: Soft and nontender. No distention. No abdominal bruits. No CVA tenderness. ?Genitourinary: Deferred ?Musculoskeletal: No lower extremity tenderness nor edema.  No joint effusions. ?Neurologic:  Normal speech and language. No gross focal neurologic deficits are appreciated. No gait instability. ?Skin:  Skin is warm, dry and intact. No rash noted. ?Psychiatric: Mood and affect are normal. Speech and behavior are normal. ? ?____________________________________________ ?  ?LABS ?         ?Component Ref Range & Units 7 d ago ?(01/27/22) 11 mo ago ?(02/28/21) 1 yr ago ?(05/07/20) 1 yr ago ?(04/09/20) 1 yr ago ?(02/19/20) 4 yr ago ?(10/24/17)  ?Color, UA  Light Yellow  Yellow  yellow   Yellow    ?Clarity, UA  Clear  Clear  cloudy   Clear    ?Glucose, UA Negative Negative  Negative  Negative   Negative    ?Bilirubin, UA  Negative  Negative  negative   Negative    ?Ketones, UA  Negative  Negative  negative   Negative    ?Spec Grav, UA 1.010 - 1.025 1.015  1.015  1.015   1.020    ?Blood, UA  Negative  Positive CM  +- CM   Negative    ?pH, UA 5.0 - 8.0 7.5  7.0  7.5   6.0    ?Protein, UA Negative Negative  Negative  Negative   Negative    ?Urobilinogen, UA 0.2 or 1.0 E.U./dL 0.2  0.2  0.2   0.2     ?Nitrite, UA  Negative  Negative  negative   Negative    ?Leukocytes, UA Negative Negative  Negative  Negative   Negative  NEGATIVE R   ?Appearance     light  CLEAR Abnormal  R    CLEAR Abnormal  R   ?Odor            ?Resulting Agency     [facility]  [facility]  ?  ? ?  ?  ?Specimen Collected: 01/27/22 10:52 Last Resulted: 01/27/22 10:52  ?  ?   ?View Encounter Conversation    ?  ?  ?  ?  ? ?Other Results from 01/27/2022 ? ? Contains abnormal data CMP12+LP+TP+TSH+6AC+CBC/D/Plt ?Order: [id number] ?Status: Final result    ?Visible to patient: Yes (seen)    ?Next appt: None    ?  Dx: Routine adult health maintenance    ?0 Result Notes ?           ?Component Ref Range & Units 7 d ago ?(01/27/22) 11 mo ago ?(02/28/21) 1 yr ago ?(04/09/20) 1 yr ago ?(04/09/20) 1 yr ago ?(04/09/20) 1 yr ago ?(02/19/20) 2 yr ago ?(01/17/20) 2 yr ago ?(01/17/20)  ?Glucose 70 - 99 mg/dL 90  98 R   104 High  CM   98 R  106 High  CM    ?Uric Acid 2.6 - 6.2 mg/dL 4.7  5.1 CM     5.7 CM     ?Comment:            Therapeutic target for gout patients: <6.0  ?BUN 6 - 24 mg/dL 9  14   10  R   12  13 R    ?Creatinine, Ser 0.57 - 1.00 mg/dL 1.03 High   0.97   0.97 R   0.91  1.17 High  R    ?eGFR >59 mL/min/1.73 67  72         ?BUN/Creatinine Ratio 9 - 23 9  14     13      ?Sodium 134 - 144 mmol/L 140  142   139 R   141  140 R    ?Potassium 3.5 - 5.2 mmol/L 3.7  4.0   3.8 R   3.6  3.5 R    ?Chloride 96 - 106 mmol/L 100  101   102 R   101  105 R    ?Calcium 8.7 - 10.2 mg/dL 9.6  9.4   9.1 R   9.0  8.9 R    ?Phosphorus 3.0 - 4.3 mg/dL 2.8 Low   2.7 Low      2.8 Low      ?Total Protein 6.0 - 8.5 g/dL 7.3  6.8  7.3 R    6.4  7.5 R    ?Albumin 3.8 - 4.8 g/dL 4.5  4.6  4.1 R    4.2  4.2 R    ?Globulin, Total 1.5 - 4.5 g/dL 2.8  2.2     2.2     ?Albumin/Globulin Ratio 1.2 - 2.2 1.6  2.1     1.9     ?Bilirubin Total 0.0 - 1.2 mg/dL 0.4  0.5  0.9 R    0.5  0.6 R    ?Alkaline Phosphatase 44 - 121 IU/L 77  78  66 R    74 R  64 R    ?LDH 119 - 226 IU/L 231 High   249 High       226     ?AST 0 - 40 IU/L 19  18  25  R    20  22 R    ?ALT 0 - 32 IU/L 16  20  19  R    15  19 R    ?GGT 0 - 60 IU/L 12  13     13      ?Iron 27 - 159 ug/dL 69  96     102     ?Cholesterol, Total 100 - 199 mg/dL 216 High   229 High

## 2022-02-03 NOTE — Addendum Note (Signed)
Addended by: Gardner Candle on: 02/03/2022 03:52 PM ? ? Modules accepted: Orders ? ?

## 2022-02-03 NOTE — Progress Notes (Signed)
Pt presents today for results. Pt still in a lot of pain.  ?

## 2022-02-09 ENCOUNTER — Ambulatory Visit: Payer: Self-pay | Admitting: Physician Assistant

## 2022-02-09 ENCOUNTER — Encounter: Payer: Self-pay | Admitting: Physician Assistant

## 2022-02-09 VITALS — BP 147/82 | HR 92 | Temp 97.7°F

## 2022-02-09 DIAGNOSIS — M792 Neuralgia and neuritis, unspecified: Secondary | ICD-10-CM

## 2022-02-09 MED ORDER — VALACYCLOVIR HCL 1 G PO TABS: 1000.0000 mg | ORAL_TABLET | Freq: Two times a day (BID) | ORAL | 0 refills | Status: AC

## 2022-02-09 NOTE — Progress Notes (Signed)
Pt presents today with shingles inner left thigh and lower back. ?

## 2022-02-09 NOTE — Progress Notes (Signed)
? ?  Subjective: Rash  ? ? Patient ID: Sara Fitzpatrick, female    DOB: 1971-05-02, 51 y.o.   MRN: [id number] ? ?HPI ?Patient presents for rash to her back and left medial thigh.  Patient states there is a burning sensation associated with the rash.  Patient believes she has shingles.  Patient stated burning sensations also on the anterior bilateral abdomen.  Patient was seen last week with complaint of intermitting numbness to the left lateral thigh.  States the rash 2 days ago.  Patient states the rash is aggravated with wearing  blue jeans which are required at work.  Patient has appointment with neurologist on 20 Feb 2022. ? ? ?Review of Systems ?Hypertension, endometriosis, Raynaud syndrome, mild peripheral edema, low back pain, and fibromyalgia. ?   ?Objective:  ? Physical Exam ? ?Temperature is 97.7, pulse 72, BP is 147/82, patient 90% O2 sat on room air. ?Patient has 3 papular lesions mid lower back.  Patient also had erythematous macular lesions left medial thigh.  No vesicle lesions are visible. ? ? ?   ?Assessment & Plan: Rash  ?Patient advised to continue to use Lidoderm patches and a prescription for Valtrex was given.  Patient advised to take pictures of lesions and follow-up with scheduled neurologist appointment ? ?

## 2022-02-20 DIAGNOSIS — E559 Vitamin D deficiency, unspecified: Secondary | ICD-10-CM | POA: Diagnosis not present

## 2022-02-20 DIAGNOSIS — M5410 Radiculopathy, site unspecified: Secondary | ICD-10-CM | POA: Diagnosis not present

## 2022-02-20 DIAGNOSIS — I10 Essential (primary) hypertension: Secondary | ICD-10-CM | POA: Diagnosis not present

## 2022-02-20 DIAGNOSIS — Z8669 Personal history of other diseases of the nervous system and sense organs: Secondary | ICD-10-CM | POA: Diagnosis not present

## 2022-02-20 DIAGNOSIS — R4189 Other symptoms and signs involving cognitive functions and awareness: Secondary | ICD-10-CM | POA: Diagnosis not present

## 2022-02-20 DIAGNOSIS — Z114 Encounter for screening for human immunodeficiency virus [HIV]: Secondary | ICD-10-CM | POA: Diagnosis not present

## 2022-02-23 DIAGNOSIS — M5410 Radiculopathy, site unspecified: Secondary | ICD-10-CM | POA: Diagnosis not present

## 2022-03-10 ENCOUNTER — Other Ambulatory Visit: Payer: Self-pay | Admitting: Neurology

## 2022-03-10 DIAGNOSIS — M5410 Radiculopathy, site unspecified: Secondary | ICD-10-CM

## 2022-03-21 ENCOUNTER — Ambulatory Visit: Payer: 59

## 2022-03-30 DIAGNOSIS — R202 Paresthesia of skin: Secondary | ICD-10-CM | POA: Diagnosis not present

## 2022-03-30 DIAGNOSIS — R200 Anesthesia of skin: Secondary | ICD-10-CM | POA: Diagnosis not present

## 2022-06-13 IMAGING — CR DG LUMBAR SPINE COMPLETE 4+V
5 series · 5 of 5 positions shown · non-contrast
Comparison: 07/13/2018

CLINICAL DATA: Back pain, radicular pain left lower extremity

EXAM:
LUMBAR SPINE - COMPLETE 4+ VIEW

[l-spine ap]
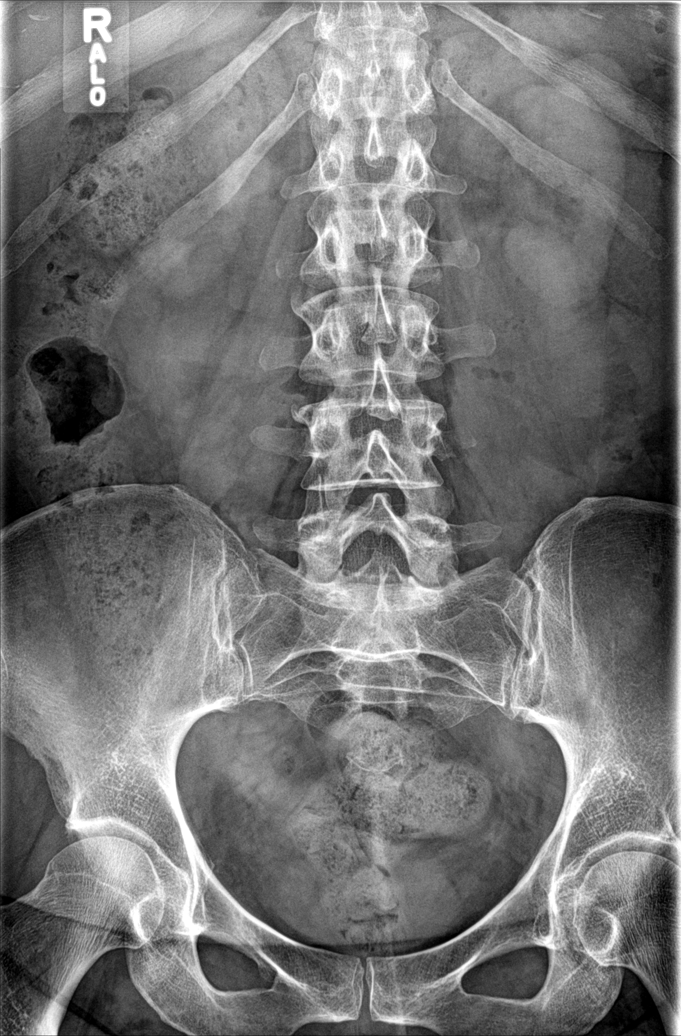

[l-spine obl (1 of 2)]
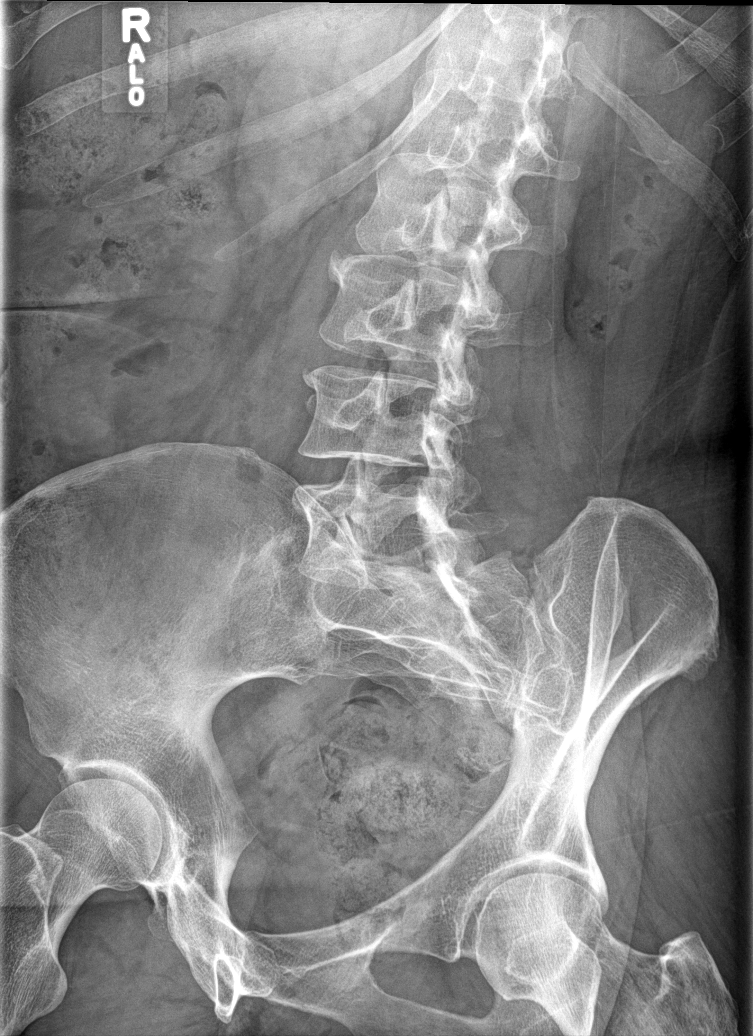

[l-spine obl (2 of 2)]
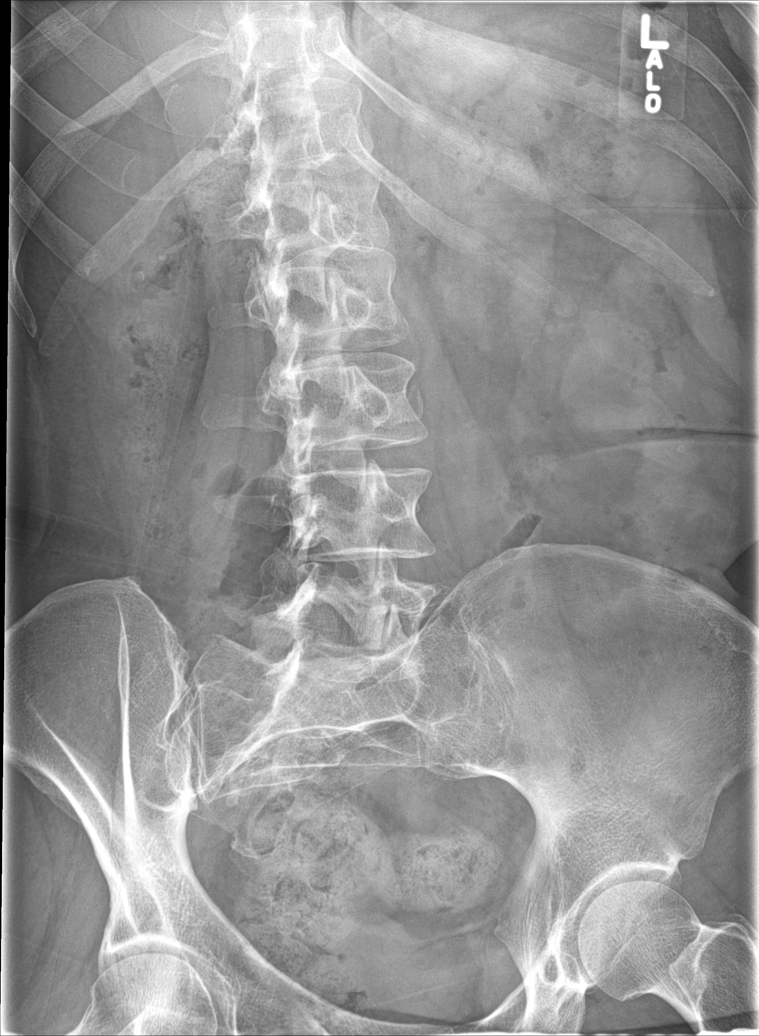

[l-spine lat]
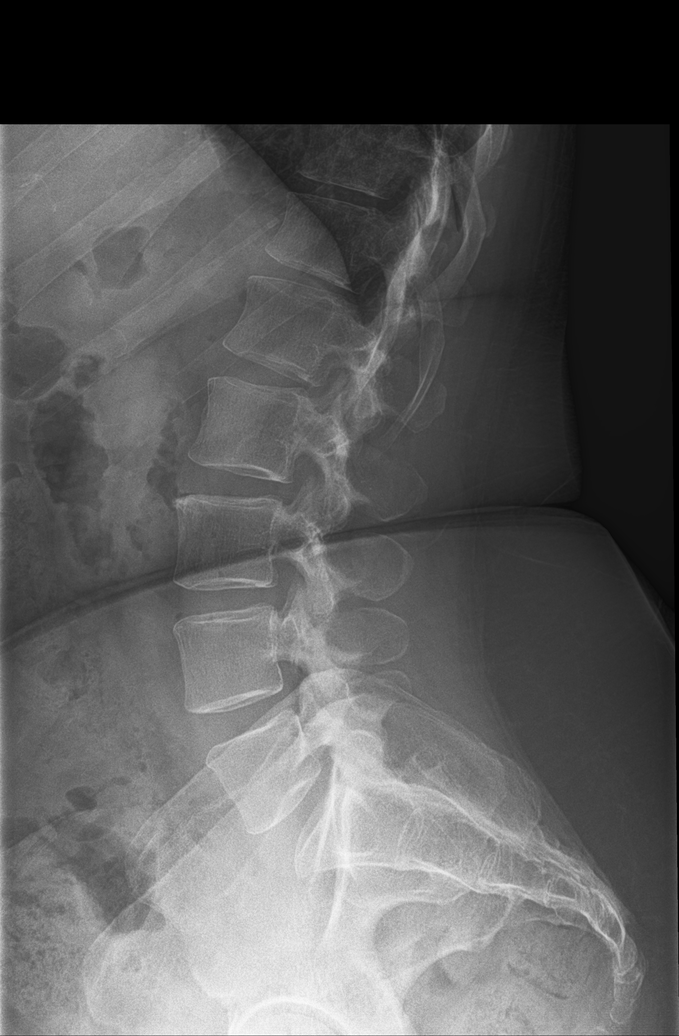

[l-spine spot]
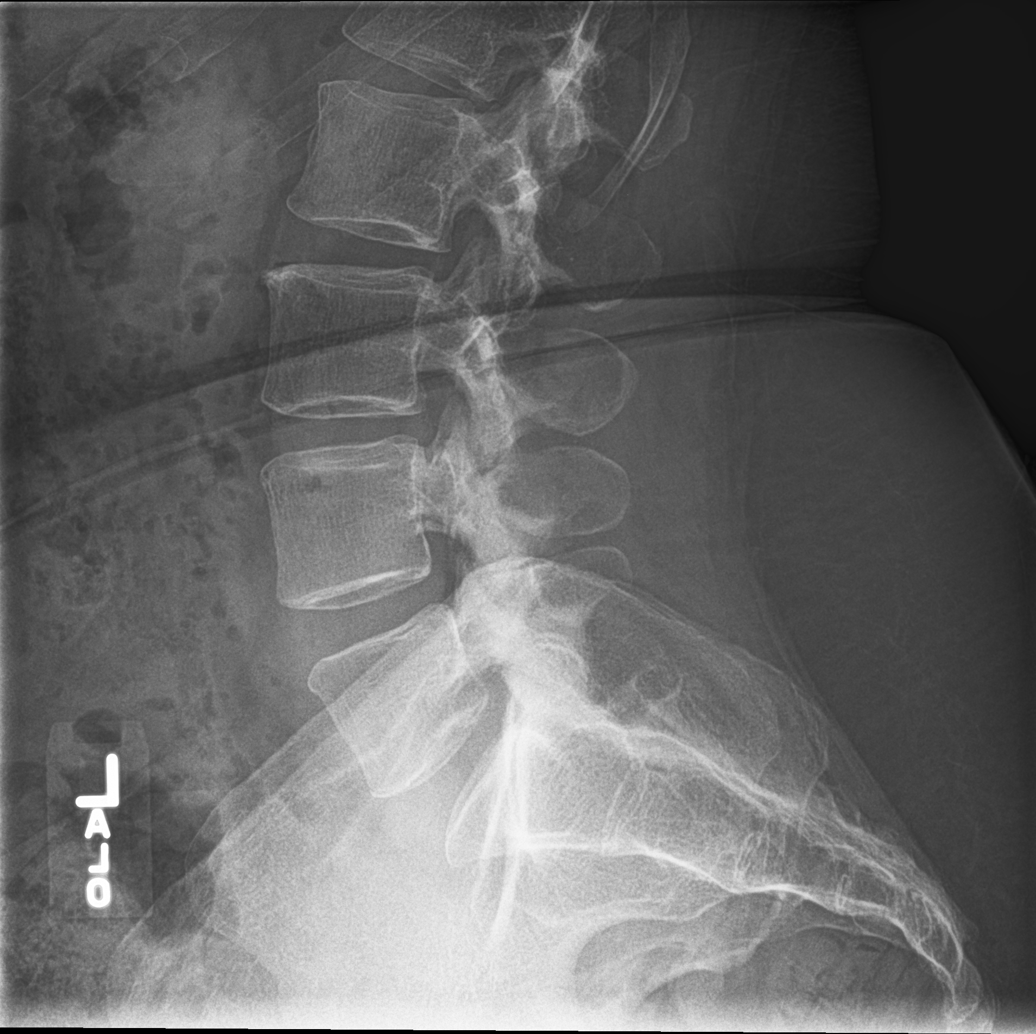

[5 of 5 positions shown; findings below may reference images not displayed]

FINDINGS: No recent fracture is seen. Small anterior bony spurs are noted at
L2-L3 and L3-L4 levels. There is no significant disc space
narrowing. Degenerative changes are noted in facet joints, more so
at L4-L5 and L5-S1 levels. Paraspinal soft tissues are unremarkable.
IMPRESSION: No recent fracture is seen. Degenerative changes are noted with
anterior bony spurs and facet hypertrophy as described in the body
of the report. There is interval progression of degenerative
changes.

## 2022-07-18 ENCOUNTER — Other Ambulatory Visit: Payer: Self-pay | Admitting: Physician Assistant

## 2022-07-18 DIAGNOSIS — I10 Essential (primary) hypertension: Secondary | ICD-10-CM

## 2022-07-20 ENCOUNTER — Other Ambulatory Visit: Payer: Self-pay | Admitting: Physician Assistant

## 2022-07-20 DIAGNOSIS — F411 Generalized anxiety disorder: Secondary | ICD-10-CM

## 2022-07-23 ENCOUNTER — Other Ambulatory Visit: Payer: Self-pay

## 2022-07-23 DIAGNOSIS — I10 Essential (primary) hypertension: Secondary | ICD-10-CM

## 2022-07-23 DIAGNOSIS — F411 Generalized anxiety disorder: Secondary | ICD-10-CM

## 2022-07-23 MED ORDER — HYDROCHLOROTHIAZIDE 25 MG PO TABS: 25.0000 mg | ORAL_TABLET | Freq: Every day | ORAL | 3 refills | Status: DC

## 2022-07-23 MED ORDER — BUSPIRONE HCL 10 MG PO TABS: 10.0000 mg | ORAL_TABLET | Freq: Three times a day (TID) | ORAL | 3 refills | Status: DC

## 2022-07-28 DIAGNOSIS — H5212 Myopia, left eye: Secondary | ICD-10-CM | POA: Diagnosis not present

## 2022-09-08 ENCOUNTER — Other Ambulatory Visit: Payer: Self-pay | Admitting: Physician Assistant

## 2022-09-08 DIAGNOSIS — R350 Frequency of micturition: Secondary | ICD-10-CM

## 2022-09-08 LAB — POCT URINALYSIS DIPSTICK
Bilirubin, UA: NEGATIVE
Blood, UA: NEGATIVE
Glucose, UA: NEGATIVE
Ketones, UA: NEGATIVE
Leukocytes, UA: NEGATIVE
Nitrite, UA: NEGATIVE
Protein, UA: NEGATIVE
Spec Grav, UA: 1.015 (ref 1.010–1.025)
Urobilinogen, UA: 0.2 E.U./dL
pH, UA: 6 (ref 5.0–8.0)

## 2022-09-08 MED ORDER — NITROFURANTOIN MONOHYD MACRO 100 MG PO CAPS: 100.0000 mg | ORAL_CAPSULE | Freq: Two times a day (BID) | ORAL | 0 refills | Status: DC

## 2022-09-08 MED ORDER — PHENAZOPYRIDINE HCL 95 MG PO TABS: 190.0000 mg | ORAL_TABLET | Freq: Three times a day (TID) | ORAL | 0 refills | Status: DC | PRN

## 2022-09-08 NOTE — Progress Notes (Signed)
S/Sx started this pat Friday Urinary frequency Pain & burning with urination Cloudy appearance Foul odor  Had some doxycycline at home & has taken 7 pills up to today.  POCT UA is clear  Reviewed above info with Nona Dell, PA-C. Stop taking the doxcycline. States he will treat with Macrobid & Pyridium No need to send specimen for a culture.  AMD

## 2023-03-02 ENCOUNTER — Ambulatory Visit: Payer: Self-pay

## 2023-03-02 DIAGNOSIS — R739 Hyperglycemia, unspecified: Secondary | ICD-10-CM

## 2023-03-02 DIAGNOSIS — Z0000 Encounter for general adult medical examination without abnormal findings: Secondary | ICD-10-CM

## 2023-03-02 LAB — POCT URINALYSIS DIPSTICK
Bilirubin, UA: NEGATIVE
Blood, UA: NEGATIVE
Glucose, UA: NEGATIVE
Ketones, UA: NEGATIVE
Leukocytes, UA: NEGATIVE
Nitrite, UA: NEGATIVE
Protein, UA: NEGATIVE
Spec Grav, UA: 1.01 (ref 1.010–1.025)
Urobilinogen, UA: 0.2 E.U./dL
pH, UA: 6 (ref 5.0–8.0)

## 2023-03-02 NOTE — Progress Notes (Signed)
Fasting labs, urine and EKG completed pre physical with [facility].

## 2023-03-03 LAB — CMP12+LP+TP+TSH+6AC+CBC/D/PLT
ALT: 12 IU/L (ref 0–32)
AST: 17 IU/L (ref 0–40)
Albumin/Globulin Ratio: 2 (ref 1.2–2.2)
Albumin: 4.4 g/dL (ref 3.9–4.9)
Alkaline Phosphatase: 66 IU/L (ref 44–121)
BUN/Creatinine Ratio: 10 (ref 9–23)
BUN: 9 mg/dL (ref 6–24)
Basophils Absolute: 0 10*3/uL (ref 0.0–0.2)
Basos: 1 %
Bilirubin Total: 0.6 mg/dL (ref 0.0–1.2)
Calcium: 9.1 mg/dL (ref 8.7–10.2)
Chloride: 106 mmol/L (ref 96–106)
Chol/HDL Ratio: 3.2 ratio (ref 0.0–4.4)
Cholesterol, Total: 200 mg/dL — ABNORMAL HIGH (ref 100–199)
Creatinine, Ser: 0.89 mg/dL (ref 0.57–1.00)
EOS (ABSOLUTE): 0.1 10*3/uL (ref 0.0–0.4)
Eos: 1 %
Estimated CHD Risk: <0.5 times avg. (ref 0.0–1.0)
Free Thyroxine Index: 2.6 (ref 1.2–4.9)
GGT: 11 IU/L (ref 0–60)
Globulin, Total: 2.2 g/dL (ref 1.5–4.5)
Glucose: 75 mg/dL (ref 70–99)
HDL: 63 mg/dL (ref >39)
Hematocrit: 39.6 % (ref 34.0–46.6)
Hemoglobin: 13.5 g/dL (ref 11.1–15.9)
Immature Grans (Abs): 0 10*3/uL (ref 0.0–0.1)
Immature Granulocytes: 0 %
Iron: 106 ug/dL (ref 27–159)
LDH: 210 IU/L (ref 119–226)
LDL Chol Calc (NIH): 125 mg/dL — ABNORMAL HIGH (ref 0–99)
Lymphocytes Absolute: 2 10*3/uL (ref 0.7–3.1)
Lymphs: 46 %
MCH: 30.8 pg (ref 26.6–33.0)
MCHC: 34.1 g/dL (ref 31.5–35.7)
MCV: 90 fL (ref 79–97)
Monocytes Absolute: 0.3 10*3/uL (ref 0.1–0.9)
Monocytes: 6 %
Neutrophils Absolute: 2 10*3/uL (ref 1.4–7.0)
Neutrophils: 46 %
Phosphorus: 3.3 mg/dL (ref 3.0–4.3)
Platelets: 256 10*3/uL (ref 150–450)
Potassium: 4.2 mmol/L (ref 3.5–5.2)
RBC: 4.38 x10E6/uL (ref 3.77–5.28)
RDW: 12.5 % (ref 11.7–15.4)
Sodium: 142 mmol/L (ref 134–144)
T3 Uptake Ratio: 29 % (ref 24–39)
T4, Total: 8.8 ug/dL (ref 4.5–12.0)
TSH: 0.699 u[IU]/mL (ref 0.450–4.500)
Total Protein: 6.6 g/dL (ref 6.0–8.5)
Triglycerides: 67 mg/dL (ref 0–149)
Uric Acid: 3.3 mg/dL (ref 2.6–6.2)
VLDL Cholesterol Cal: 12 mg/dL (ref 5–40)
WBC: 4.3 10*3/uL (ref 3.4–10.8)
eGFR: 79 mL/min/{1.73_m2} (ref >59)

## 2023-03-03 LAB — HGB A1C W/O EAG: Hgb A1c MFr Bld: 5.2 % (ref 4.8–5.6)

## 2023-03-09 ENCOUNTER — Encounter: Payer: Self-pay | Admitting: Physician Assistant

## 2023-03-09 ENCOUNTER — Ambulatory Visit: Payer: 59 | Admitting: Physician Assistant

## 2023-03-09 VITALS — BP 116/60 | HR 89 | Temp 98.2°F | Resp 14 | Ht 62.0 in | Wt 165.0 lb

## 2023-03-09 DIAGNOSIS — J9801 Acute bronchospasm: Secondary | ICD-10-CM

## 2023-03-09 DIAGNOSIS — Z0000 Encounter for general adult medical examination without abnormal findings: Secondary | ICD-10-CM

## 2023-03-09 MED ORDER — METHYLPREDNISOLONE 4 MG PO TBPK
ORAL_TABLET | ORAL | 0 refills | Status: DC
Start: 2023-03-09 — End: 2024-03-21

## 2023-03-09 MED ORDER — BENZONATATE 100 MG PO CAPS: 200.0000 mg | ORAL_CAPSULE | Freq: Three times a day (TID) | ORAL | 0 refills | Status: DC | PRN

## 2023-03-09 NOTE — Progress Notes (Signed)
Pt presents today to complete physical, Pt denies any issues or concerns at this time/CL,RMA 

## 2023-03-09 NOTE — Progress Notes (Signed)
[facility] [place] [facility] ____________________________________________   None    (approximate)  I have reviewed the triage vital signs and the nursing notes.   HISTORY  Chief Complaint No chief complaint on file.   HPI Sara Fitzpatrick is a 52 y.o. female patient presents for annual physical exam.  Patient voiced concern for chronic cough for approximately 1 month.  Patient states cough is nonproductive.  Onset after viral infection 1 month ago.  Denies dyspnea.         Past Medical History:  Diagnosis Date   Blood clot in vein    right arm   Endometriosis    Fibromyalgia    Hypertension    Migraine    Mild peripheral edema 01/18/2020   Pelvic pain    Stroke (cerebrum) [facility])     Patient Active Problem List   Diagnosis Date Noted   Low back pain 06/18/2021   Fibromyalgia 06/18/2021   Class 3 severe obesity due to excess calories without serious comorbidity with body mass index (BMI) of 40.0 to 44.9 in adult (HCC) 03/19/2020   Hypertension, essential 02/12/2020   Mild peripheral edema 01/18/2020   Chest pain at rest 04/24/2018   Ischemic finger 06/03/2017   Embolism and thrombosis of arteries of upper extremities (HCC) 06/03/2017   Pain in finger of right hand 06/03/2017   Raynaud phenomenon 06/03/2017   History of cesarean section 09/01/2016   Status post laparoscopic supracervical hysterectomy 09/01/2016   Pelvic pain 09/01/2016   Endometriosis 09/01/2016    Past Surgical History:  Procedure Laterality Date   ABDOMINAL HYSTERECTOMY     lsh   CESAREAN SECTION     LAPAROSCOPY     x 3    Prior to Admission medications   Medication Sig Start Date End Date Taking? Authorizing Provider  busPIRone (BUSPAR) 10 MG tablet Take 1 tablet (10 mg total) by mouth 3 (three) times daily. 07/23/22   Joni Reining, PA-C  hydrochlorothiazide (HYDRODIURIL) 25 MG tablet Take 1 tablet (25 mg total) by mouth daily. 07/23/22   Joni Reining, PA-C   lidocaine (LIDODERM) 5 % Place 1 patch onto the skin daily. Remove & Discard patch within 12 hours or as directed by MD 02/03/22   Joni Reining, PA-C  nitrofurantoin, macrocrystal-monohydrate, (MACROBID) 100 MG capsule Take 1 capsule (100 mg total) by mouth 2 (two) times daily. 09/08/22   Joni Reining, PA-C  OZEMPIC, 1 MG/DOSE, 4 MG/3ML SOPN Inject into the skin. 01/21/22   [provider]  phenazopyridine (PYRIDIUM) 95 MG tablet Take 2 tablets (190 mg total) by mouth 3 (three) times daily as needed for pain. 09/08/22   Joni Reining, PA-C  traMADol (ULTRAM) 50 MG tablet Take 1 tablet (50 mg total) by mouth every 6 (six) hours as needed for moderate pain. 02/02/22   Joni Reining, PA-C    Allergies Codeine, Other, Hydrocodone-acetaminophen, and Morphine and codeine  Family History  Problem Relation Age of Onset   Diabetes Mother    Heart disease Mother    Heart disease Father    Leukemia Maternal Grandmother    Breast cancer Maternal Grandmother        60-70   Heart disease Maternal Grandmother    Colon cancer Maternal Grandfather    Heart failure Maternal Grandfather    Heart disease Maternal Grandfather    Heart failure Paternal Grandmother    Heart disease Paternal Grandmother    Heart disease Paternal Grandfather  Ovarian cancer Neg Hx     Social History Social History   Tobacco Use   Smoking status: Former   Smokeless tobacco: Never  Building services engineer Use: Every day  Substance Use Topics   Alcohol use: Yes    Comment: occasionally    Drug use: Never    Review of Systems Constitutional: No fever/chills Eyes: No visual changes. ENT: No sore throat. Cardiovascular: Denies chest pain. Respiratory: Denies shortness of breath. Gastrointestinal: No abdominal pain.  No nausea, no vomiting.  No diarrhea.  No constipation. Genitourinary: Negative for dysuria. Musculoskeletal: Negative for back pain. Skin: Negative for rash. Neurological: Negative  for headaches, focal weakness or numbness.  Fibromyalgia Endocrine: Hypertension Allergic/Immunilogical: Codeine and morphine ____________________________________________   PHYSICAL EXAM: VITAL SIGNS: BP 116/60  Pulse 89  Resp 14  Temp 98.2 F (36.8 C)  Temp src Temporal  SpO2 98 %  Weight 165 lb (74.8 kg)  Height 5\' 2"  (1.575 m)   BMI 30.18 kg/m2  BSA 1.81 m2     Constitutional: Alert and oriented. Well appearing and in no acute distress. Eyes: Conjunctivae are normal. PERRL. EOMI. Head: Atraumatic. Nose: No congestion/rhinnorhea. Mouth/Throat: Mucous membranes are moist.  Oropharynx non-erythematous. Neck: No stridor.  No cervical spine tenderness to palpation. Hematological/Lymphatic/Immunilogical: No cervical lymphadenopathy. Cardiovascular: Normal rate, regular rhythm. Grossly normal heart sounds.  Good peripheral circulation. Respiratory: Normal respiratory effort.  No retractions. Lungs CTAB. Gastrointestinal: Soft and nontender. No distention. No abdominal bruits. No CVA tenderness. Genitourinary: Deferred Musculoskeletal: No lower extremity tenderness nor edema.  No joint effusions. Neurologic:  Normal speech and language. No gross focal neurologic deficits are appreciated. No gait instability. Skin:  Skin is warm, dry and intact. No rash noted. Psychiatric: Mood and affect are normal. Speech and behavior are normal.  ____________________________________________   LABS           Component Ref Range & Units 7 d ago (03/02/23) 6 mo ago (09/08/22) 1 yr ago (01/27/22) 2 yr ago (02/28/21) 2 yr ago (05/07/20) 2 yr ago (04/09/20) 3 yr ago (02/19/20)  Color, UA yellow Dark Yellow Light Yellow Yellow yellow  Yellow  Clarity, UA clear Cloudy Clear Clear cloudy  Clear  Glucose, UA Negative Negative Negative Negative Negative Negative  Negative  Bilirubin, UA neg Negative Negative Negative negative  Negative  Ketones, UA neg Negative Negative Negative negative   Negative  Spec Grav, UA 1.010 - 1.025 1.010 1.015 1.015 1.015 1.015  1.020  Blood, UA neg Negative Negative Positive CM +- CM  Negative  pH, UA 5.0 - 8.0 6.0 6.0 7.5 7.0 7.5  6.0  Protein, UA Negative Negative Negative Negative Negative Negative  Negative  Urobilinogen, UA 0.2 or 1.0 E.U./dL 0.2 0.2 0.2 0.2 0.2  0.2  Nitrite, UA neg Negative Negative Negative negative  Negative  Leukocytes, UA Negative Negative Negative Negative Negative Negative  Negative  Appearance  Cloudy   light CLEAR Abnormal  R   Odor  Strong       Resulting Agency      [facility]                             Component Ref Range & Units 7 d ago (03/02/23) 1 yr ago (01/27/22) 2 yr ago (02/28/21) 2 yr ago (04/09/20) 2 yr ago (04/09/20) 2 yr ago (04/09/20) 3 yr ago (02/19/20)  Glucose 70 - 99 mg/dL 75 90 98 R  104 High  CM  98 R  Uric Acid 2.6 - 6.2 mg/dL 3.3 4.7 CM 5.1 CM    5.7 CM  Comment:            Therapeutic target for gout patients: <6.0  BUN 6 - 24 mg/dL 9 9 14  10  R  12  Creatinine, Ser 0.57 - 1.00 mg/dL 1.61 0.96 High  0.45  4.09 R  0.91  eGFR >59 mL/min/1.73 79 67 72      BUN/Creatinine Ratio 9 - 23 10 9 14    13   Sodium 134 - 144 mmol/L 142 140 142  139 R  141  Potassium 3.5 - 5.2 mmol/L 4.2 3.7 4.0  3.8 R  3.6  Chloride 96 - 106 mmol/L 106 100 101  102 R  101  Calcium 8.7 - 10.2 mg/dL 9.1 9.6 9.4  9.1 R  9.0  Phosphorus 3.0 - 4.3 mg/dL 3.3 2.8 Low  2.7 Low     2.8 Low   Total Protein 6.0 - 8.5 g/dL 6.6 7.3 6.8 7.3 R   6.4  Albumin 3.9 - 4.9 g/dL 4.4 4.5 R 4.6 R 4.1 R   4.2 R  Globulin, Total 1.5 - 4.5 g/dL 2.2 2.8 2.2    2.2  Albumin/Globulin Ratio 1.2 - 2.2 2.0 1.6 2.1    1.9  Bilirubin Total 0.0 - 1.2 mg/dL 0.6 0.4 0.5 0.9 R   0.5  Alkaline Phosphatase 44 - 121 IU/L 66 77 78 66 R   74 R  LDH 119 - 226 IU/L 210 231 High  249 High     226  AST 0 - 40 IU/L 17 19 18 25  R   20  ALT 0 - 32 IU/L 12 16 20 19  R   15  GGT 0 - 60 IU/L 11 12 13    13   Iron 27 - 159 ug/dL  811 69 96    914  Cholesterol, Total 100 - 199 mg/dL 782 High  956 High  213 High     182  Triglycerides 0 - 149 mg/dL 67 93 82    75  HDL >08 mg/dL 63 58 67    58  VLDL Cholesterol Cal 5 - 40 mg/dL 12 17 14    14   LDL Chol Calc (NIH) 0 - 99 mg/dL 657 High  846 High  962 High     110 High   Chol/HDL Ratio 0.0 - 4.4 ratio 3.2 3.7 CM 3.4 CM    3.1 CM  Comment:                                   T. Chol/HDL Ratio                                             Men  Women                               1/2 Avg.Risk  3.4    3.3                                   Avg.Risk  5.0    4.4  2X Avg.Risk  9.6    7.1                                3X Avg.Risk 23.4   11.0  Estimated CHD Risk 0.0 - 1.0 times avg.  < 0.5 0.7 CM 0.5 CM     < 0.5 CM  Comment: The CHD Risk is based on the T. Chol/HDL ratio. Other factors affect CHD Risk such as hypertension, smoking, diabetes, severe obesity, and family history of premature CHD.  TSH 0.450 - 4.500 uIU/mL 0.699 0.709 0.887    0.908  T4, Total 4.5 - 12.0 ug/dL 8.8 16.1 8.3    7.5  T3 Uptake Ratio 24 - 39 % 29 28 27    27   Free Thyroxine Index 1.2 - 4.9 2.6 2.9 2.2    2.0  WBC 3.4 - 10.8 x10E3/uL 4.3 4.8 5.0   6.7 R 4.9  RBC 3.77 - 5.28 x10E6/uL 4.38 4.79 4.43   4.55 R 4.43  Hemoglobin 11.1 - 15.9 g/dL 09.6 04.5 40.9   81.1 R 13.8  Hematocrit 34.0 - 46.6 % 39.6 42.6 40.2   41.0 R 39.7  MCV 79 - 97 fL 90 89 91   90.1 R 90  MCH 26.6 - 33.0 pg 30.8 30.3 30.5   30.8 R 31.2  MCHC 31.5 - 35.7 g/dL 91.4 78.2 95.6   21.3 R 34.8  RDW 11.7 - 15.4 % 12.5 13.1 12.9   13.6 R 12.5  Platelets 150 - 450 x10E3/uL 256 275 248   190 R 259  Neutrophils Not Estab. % 46 53 52    56  Lymphs Not Estab. % 46 39 40    35  Monocytes Not Estab. % 6 6 6    7   Eos Not Estab. % 1 1 2    2   Basos Not Estab. % 1 1 0    0  Neutrophils Absolute 1.4 - 7.0 x10E3/uL 2.0 2.5 2.6    2.7  Lymphocytes Absolute 0.7 - 3.1 x10E3/uL 2.0 1.9 2.0     1.7  Monocytes Absolute 0.1 - 0.9 x10E3/uL 0.3 0.3 0.3    0.4  EOS (ABSOLUTE) 0.0 - 0.4 x10E3/uL 0.1 0.1 0.1    0.1  Basophils Absolute 0.0 - 0.2 x10E3/uL 0.0 0.0 0.0    0.0  Immature Granulocytes Not Estab. % 0 0 0    0  Immature Grans (Abs) 0.0 - 0.1 x10E3/uL 0.0 0.0 0.0    0.0  Resulting Agency [facility]            Component Ref Range & Units 7 d ago 1 yr ago 2 yr ago  Hgb A1c MFr Bld 4.8 - 5.6 % 5.2 5.2 CM 5.6 CM  Comment:          Prediabetes: 5.7 - 6.4          Diabetes: >6.4         ____________________________________________  EKG  Sinus rhythm at 74 bpm ____________________________________________    ____________________________________________   INITIAL IMPRESSION / ASSESSMENT AND PLAN   As part of my medical decision making, I reviewed the following data within the electronic MEDICAL RECORD NUMBER      No acute findings on physical exam, EKG, and lab results.  Patient has cough secondary bronchospasms.  Prescription for Tessalon Perles and Medrol Dosepak sent  to pharmacy.        ____________________________________________   FINAL CLINICAL IMPRESSION  Well exam   ED Discharge Orders     None        Note:  This document was prepared using Dragon voice recognition software and may include unintentional dictation errors.

## 2023-04-09 ENCOUNTER — Other Ambulatory Visit: Payer: Self-pay | Admitting: Physician Assistant

## 2023-05-10 ENCOUNTER — Other Ambulatory Visit: Payer: Self-pay | Admitting: *Deleted

## 2023-05-10 ENCOUNTER — Ambulatory Visit
Admission: RE | Admit: 2023-05-10 | Discharge: 2023-05-10 | Disposition: A | Discharge status: Home / Self Care | Payer: 59 | Source: Ambulatory Visit

## 2023-05-10 DIAGNOSIS — Z0000 Encounter for general adult medical examination without abnormal findings: Secondary | ICD-10-CM

## 2023-05-10 DIAGNOSIS — Z1231 Encounter for screening mammogram for malignant neoplasm of breast: Secondary | ICD-10-CM | POA: Diagnosis not present

## 2023-05-26 ENCOUNTER — Other Ambulatory Visit: Payer: Self-pay | Admitting: Physician Assistant

## 2023-05-26 DIAGNOSIS — Z0000 Encounter for general adult medical examination without abnormal findings: Secondary | ICD-10-CM

## 2023-06-23 DIAGNOSIS — R202 Paresthesia of skin: Secondary | ICD-10-CM | POA: Diagnosis not present

## 2023-06-23 DIAGNOSIS — R200 Anesthesia of skin: Secondary | ICD-10-CM | POA: Diagnosis not present

## 2023-07-18 ENCOUNTER — Other Ambulatory Visit: Payer: Self-pay | Admitting: Physician Assistant

## 2023-07-18 DIAGNOSIS — F411 Generalized anxiety disorder: Secondary | ICD-10-CM

## 2023-07-22 ENCOUNTER — Other Ambulatory Visit: Payer: Self-pay

## 2023-07-22 DIAGNOSIS — F411 Generalized anxiety disorder: Secondary | ICD-10-CM

## 2023-07-22 MED ORDER — BUSPIRONE HCL 10 MG PO TABS
10.0000 mg | ORAL_TABLET | Freq: Three times a day (TID) | ORAL | 3 refills | Status: DC
Start: 2023-07-22 — End: 2024-07-27

## 2023-08-12 DIAGNOSIS — F411 Generalized anxiety disorder: Secondary | ICD-10-CM | POA: Diagnosis not present

## 2023-08-12 DIAGNOSIS — F902 Attention-deficit hyperactivity disorder, combined type: Secondary | ICD-10-CM | POA: Diagnosis not present

## 2023-08-12 DIAGNOSIS — F4312 Post-traumatic stress disorder, chronic: Secondary | ICD-10-CM | POA: Diagnosis not present

## 2023-08-31 DIAGNOSIS — F902 Attention-deficit hyperactivity disorder, combined type: Secondary | ICD-10-CM | POA: Diagnosis not present

## 2023-08-31 DIAGNOSIS — F411 Generalized anxiety disorder: Secondary | ICD-10-CM | POA: Diagnosis not present

## 2023-08-31 DIAGNOSIS — F4312 Post-traumatic stress disorder, chronic: Secondary | ICD-10-CM | POA: Diagnosis not present

## 2023-09-07 DIAGNOSIS — F4312 Post-traumatic stress disorder, chronic: Secondary | ICD-10-CM | POA: Diagnosis not present

## 2023-09-07 DIAGNOSIS — F902 Attention-deficit hyperactivity disorder, combined type: Secondary | ICD-10-CM | POA: Diagnosis not present

## 2023-09-07 DIAGNOSIS — F411 Generalized anxiety disorder: Secondary | ICD-10-CM | POA: Diagnosis not present

## 2023-09-24 DIAGNOSIS — F4312 Post-traumatic stress disorder, chronic: Secondary | ICD-10-CM | POA: Diagnosis not present

## 2023-09-24 DIAGNOSIS — F411 Generalized anxiety disorder: Secondary | ICD-10-CM | POA: Diagnosis not present

## 2023-09-24 DIAGNOSIS — F902 Attention-deficit hyperactivity disorder, combined type: Secondary | ICD-10-CM | POA: Diagnosis not present

## 2023-12-27 DIAGNOSIS — F411 Generalized anxiety disorder: Secondary | ICD-10-CM | POA: Diagnosis not present

## 2023-12-27 DIAGNOSIS — F4312 Post-traumatic stress disorder, chronic: Secondary | ICD-10-CM | POA: Diagnosis not present

## 2023-12-27 DIAGNOSIS — F902 Attention-deficit hyperactivity disorder, combined type: Secondary | ICD-10-CM | POA: Diagnosis not present

## 2024-03-21 ENCOUNTER — Encounter: Payer: Self-pay | Admitting: Physician Assistant

## 2024-03-21 ENCOUNTER — Ambulatory Visit: Payer: Self-pay

## 2024-03-21 ENCOUNTER — Ambulatory Visit: Payer: Self-pay | Admitting: Physician Assistant

## 2024-03-21 VITALS — BP 142/63 | HR 93 | Resp 16 | Ht 63.0 in | Wt 190.0 lb

## 2024-03-21 DIAGNOSIS — Z0289 Encounter for other administrative examinations: Secondary | ICD-10-CM

## 2024-03-21 NOTE — Progress Notes (Signed)
 [facility] [place] [facility] ____________________________________________   None    (approximate)  I have reviewed the triage vital signs and the nursing notes.   HISTORY  Chief Complaint No chief complaint on file.   HPI Sara Fitzpatrick is a 53 y.o. female patient presents for preemployment physical for the [employer].         Past Medical History:  Diagnosis Date   Blood clot in vein    right arm   Endometriosis    Fibromyalgia    Hypertension    Migraine    Mild peripheral edema 01/18/2020   Pelvic pain    Stroke (cerebrum) [facility])     Patient Active Problem List   Diagnosis Date Noted   Low back pain 06/18/2021   Fibromyalgia 06/18/2021   Class 3 severe obesity due to excess calories without serious comorbidity with body mass index (BMI) of 40.0 to 44.9 in adult 03/19/2020   Hypertension, essential 02/12/2020   Mild peripheral edema 01/18/2020   Chest pain at rest 04/24/2018   Ischemic finger 06/03/2017   Embolism and thrombosis of arteries of upper extremities (HCC) 06/03/2017   Pain in finger of right hand 06/03/2017   Raynaud phenomenon 06/03/2017   History of cesarean section 09/01/2016   Status post laparoscopic supracervical hysterectomy 09/01/2016   Pelvic pain 09/01/2016   Endometriosis 09/01/2016    Past Surgical History:  Procedure Laterality Date   ABDOMINAL HYSTERECTOMY     lsh   CESAREAN SECTION     LAPAROSCOPY     x 3    Prior to Admission medications   Medication Sig Start Date End Date Taking? Authorizing Provider  amphetamine-dextroamphetamine (ADDERALL XR) 20 MG 24 hr capsule Take 20 mg by mouth every morning. 03/04/24   [provider]  busPIRone  (BUSPAR ) 10 MG tablet Take 1 tablet (10 mg total) by mouth 3 (three) times daily. 07/22/23   Marcina Severe, PA-C  Cholecalciferol (VITAMIN D3) 50 MCG (2000 UT) CAPS Take 1 capsule by mouth daily.    [provider]  gabapentin  (NEURONTIN) 300 MG capsule Take 300 mg by mouth at bedtime.    [provider]  LORazepam  (ATIVAN ) 0.5 MG tablet Take 0.5 mg by mouth as needed for anxiety.    [provider]  methocarbamol (ROBAXIN) 500 MG tablet Take 500 mg by mouth daily as needed for muscle spasms.    [provider]  terbinafine (LAMISIL) 250 MG tablet Take 250 mg by mouth daily. 11/06/23   [provider]  vitamin B-12 (CYANOCOBALAMIN ) 100 MCG tablet Take by mouth daily.    [provider]    Allergies Codeine, Morphine, Other, Hydrocodone-acetaminophen , and Morphine and codeine  Family History  Problem Relation Age of Onset   Breast cancer Mother    Diabetes Mother    Heart disease Mother    Heart disease Father    Leukemia Maternal Grandmother    Heart disease Maternal Grandmother    Colon cancer Maternal Grandfather    Heart failure Maternal Grandfather    Heart disease Maternal Grandfather    Heart failure Paternal Grandmother    Heart disease Paternal Grandmother    Breast cancer Paternal Grandfather    Heart disease Paternal Grandfather    Ovarian cancer Neg Hx     Social History Social History   Tobacco Use   Smoking status: Former   Smokeless tobacco: Never  Vaping Use   Vaping status: Every Day  Substance Use Topics   Alcohol use: Yes    Comment: occasionally    Drug use: Never    Review of Systems Constitutional: No fever/chills Eyes: No visual changes. ENT: No sore throat. Cardiovascular: Denies chest pain. Respiratory: Denies shortness of breath. Gastrointestinal: No abdominal pain.  No nausea, no vomiting.  No diarrhea.  No constipation. Genitourinary: Negative for dysuria. Musculoskeletal: Negative for back pain. Skin: Negative for rash. Neurological: Negative for headaches, focal weakness or numbness. Psychiatric: ADHD and anxiety. Allergic/Immunilogical: Codeine and  morphine  ____________________________________________   PHYSICAL EXAM:  VITAL SIGNS: BP 142/63BP. 142/63. Data is abnormal. Taken on 03/21/24 3:27 PM  Pulse Rate 93  Weight 190 lb (86.2 kg)  Height 5\' 3"  (1.6 m)  Resp 16  SpO2 100 %   BMI: 33.66 kg/m2  BSA: 1.96 m2   Constitutional: Alert and oriented. Well appearing and in no acute distress. Eyes: Conjunctivae are normal. PERRL. EOMI. Head: Atraumatic. Nose: No congestion/rhinnorhea. Mouth/Throat: Mucous membranes are moist.  Oropharynx non-erythematous. Neck: No stridor. No cervical spine tenderness to palpation. Hematological/Lymphatic/Immunilogical: No cervical lymphadenopathy. Cardiovascular: Normal rate, regular rhythm. Grossly normal heart sounds.  Good peripheral circulation. Respiratory: Normal respiratory effort.  No retractions. Lungs CTAB. Gastrointestinal: Soft and nontender. No distention. No abdominal bruits. No CVA tenderness. Genitourinary: Deferred Musculoskeletal: No lower extremity tenderness nor edema.  No joint effusions. Neurologic:  Normal speech and language. No gross focal neurologic deficits are appreciated. No gait instability. Skin:  Skin is warm, dry and intact. No rash noted. Psychiatric: Mood and affect are normal. Speech and behavior are normal.  ____________________________________________   LABS Pending ____________________________________________  EKG  Sinus rhythm at 98 bpm.  Left atrial enlargement.  Asymptomatic. ____________________________________________    ____________________________________________   INITIAL IMPRESSION / ASSESSMENT AND PLAN / ED COURSE  As part of my medical decision making, I reviewed the following data within the electronic MEDICAL RECORD NUMBER              ____________________________________________   FINAL CLINICAL IMPRESSION     ED Discharge Orders     None        Note:  This document was prepared using Dragon voice recognition  software and may include unintentional dictation errors.

## 2024-03-23 LAB — CMP12+LP+TP+TSH+6AC+CBC/D/PLT
ALT: 24 IU/L (ref 0–32)
AST: 27 IU/L (ref 0–40)
Albumin: 4.6 g/dL (ref 3.8–4.9)
Alkaline Phosphatase: 96 IU/L (ref 44–121)
BUN/Creatinine Ratio: 12 (ref 9–23)
BUN: 12 mg/dL (ref 6–24)
Basophils Absolute: 0 10*3/uL (ref 0.0–0.2)
Basos: 1 %
Bilirubin Total: 0.3 mg/dL (ref 0.0–1.2)
Calcium: 8.9 mg/dL (ref 8.7–10.2)
Chloride: 101 mmol/L (ref 96–106)
Chol/HDL Ratio: 3.6 ratio (ref 0.0–4.4)
Cholesterol, Total: 238 mg/dL — ABNORMAL HIGH (ref 100–199)
Creatinine, Ser: 1 mg/dL (ref 0.57–1.00)
EOS (ABSOLUTE): 0.1 10*3/uL (ref 0.0–0.4)
Eos: 2 %
Estimated CHD Risk: 0.6 times avg. (ref 0.0–1.0)
Free Thyroxine Index: 2.5 (ref 1.2–4.9)
GGT: 13 IU/L (ref 0–60)
Globulin, Total: 2.6 g/dL (ref 1.5–4.5)
Glucose: 129 mg/dL — ABNORMAL HIGH (ref 70–99)
HDL: 67 mg/dL (ref >39)
Hematocrit: 41.8 % (ref 34.0–46.6)
Hemoglobin: 14 g/dL (ref 11.1–15.9)
Immature Grans (Abs): 0 10*3/uL (ref 0.0–0.1)
Immature Granulocytes: 0 %
Iron: 78 ug/dL (ref 27–159)
LDH: 268 IU/L — ABNORMAL HIGH (ref 119–226)
LDL Chol Calc (NIH): 152 mg/dL — ABNORMAL HIGH (ref 0–99)
Lymphocytes Absolute: 2 10*3/uL (ref 0.7–3.1)
Lymphs: 38 %
MCH: 31.3 pg (ref 26.6–33.0)
MCHC: 33.5 g/dL (ref 31.5–35.7)
MCV: 94 fL (ref 79–97)
Monocytes Absolute: 0.2 10*3/uL (ref 0.1–0.9)
Monocytes: 5 %
Neutrophils Absolute: 2.9 10*3/uL (ref 1.4–7.0)
Neutrophils: 54 %
Phosphorus: 3.3 mg/dL (ref 3.0–4.3)
Platelets: 264 10*3/uL (ref 150–450)
Potassium: 4.1 mmol/L (ref 3.5–5.2)
RBC: 4.47 x10E6/uL (ref 3.77–5.28)
RDW: 12.4 % (ref 11.7–15.4)
Sodium: 141 mmol/L (ref 134–144)
T3 Uptake Ratio: 28 % (ref 24–39)
T4, Total: 9 ug/dL (ref 4.5–12.0)
TSH: 1.1 u[IU]/mL (ref 0.450–4.500)
Total Protein: 7.2 g/dL (ref 6.0–8.5)
Triglycerides: 110 mg/dL (ref 0–149)
Uric Acid: 3.9 mg/dL (ref 3.0–7.2)
VLDL Cholesterol Cal: 19 mg/dL (ref 5–40)
WBC: 5.3 10*3/uL (ref 3.4–10.8)
eGFR: 68 mL/min/{1.73_m2} (ref >59)

## 2024-03-23 LAB — QUANTIFERON-TB GOLD PLUS
QuantiFERON Mitogen Value: >10 [IU]/mL
QuantiFERON Nil Value: 0.02 [IU]/mL
QuantiFERON TB1 Ag Value: 0.01 [IU]/mL
QuantiFERON TB2 Ag Value: 0.01 [IU]/mL
QuantiFERON-TB Gold Plus: NEGATIVE

## 2024-03-25 ENCOUNTER — Encounter: Payer: Self-pay | Admitting: Physician Assistant

## 2024-03-25 DIAGNOSIS — F411 Generalized anxiety disorder: Secondary | ICD-10-CM

## 2024-03-25 DIAGNOSIS — F909 Attention-deficit hyperactivity disorder, unspecified type: Secondary | ICD-10-CM

## 2024-03-27 DIAGNOSIS — F909 Attention-deficit hyperactivity disorder, unspecified type: Secondary | ICD-10-CM | POA: Insufficient documentation

## 2024-03-27 DIAGNOSIS — F411 Generalized anxiety disorder: Secondary | ICD-10-CM | POA: Insufficient documentation

## 2024-05-12 ENCOUNTER — Ambulatory Visit
Admission: RE | Admit: 2024-05-12 | Discharge: 2024-05-12 | Disposition: A | Discharge status: Home / Self Care | Source: Ambulatory Visit | Attending: Physician Assistant | Admitting: Physician Assistant

## 2024-05-12 ENCOUNTER — Other Ambulatory Visit: Payer: Self-pay | Admitting: Emergency Medicine

## 2024-05-12 ENCOUNTER — Other Ambulatory Visit: Payer: Self-pay | Admitting: Physician Assistant

## 2024-05-12 DIAGNOSIS — Z1231 Encounter for screening mammogram for malignant neoplasm of breast: Secondary | ICD-10-CM | POA: Diagnosis not present

## 2024-07-27 ENCOUNTER — Other Ambulatory Visit: Payer: Self-pay | Admitting: Physician Assistant

## 2024-07-27 DIAGNOSIS — F411 Generalized anxiety disorder: Secondary | ICD-10-CM
